# Patient Record
Sex: Male | Born: 1935 | Race: White | Hispanic: No | Marital: Single | Smoking: Never smoker
Health system: Southern US, Community
[De-identification: ages and names within clinical notes are randomized; demographics above are authoritative.]

## PROBLEM LIST (undated history)

## (undated) DIAGNOSIS — I509 Heart failure, unspecified: Secondary | ICD-10-CM

## (undated) DIAGNOSIS — I4891 Unspecified atrial fibrillation: Secondary | ICD-10-CM

## (undated) DIAGNOSIS — I255 Ischemic cardiomyopathy: Secondary | ICD-10-CM

## (undated) DIAGNOSIS — I453 Trifascicular block: Secondary | ICD-10-CM

## (undated) DIAGNOSIS — Z950 Presence of cardiac pacemaker: Secondary | ICD-10-CM

## (undated) HISTORY — DX: Ischemic cardiomyopathy: I25.5

## (undated) HISTORY — DX: Unspecified atrial fibrillation: I48.91

## (undated) HISTORY — PX: AORTIC VALVE REPLACEMENT: SHX41

## (undated) HISTORY — DX: Trifascicular block: I45.3

## (undated) HISTORY — DX: Heart failure, unspecified: I50.9

## (undated) HISTORY — DX: Presence of cardiac pacemaker: Z95.0

## (undated) HISTORY — PX: PACEMAKER INSERTION: SHX728

## (undated) HISTORY — PX: CORONARY ARTERY BYPASS GRAFT: SHX141

---

## 2002-05-09 ENCOUNTER — Ambulatory Visit (HOSPITAL_COMMUNITY): Admission: RE | Admit: 2002-05-09 | Discharge: 2002-05-10 | Payer: Self-pay | Admitting: *Deleted

## 2002-07-14 ENCOUNTER — Ambulatory Visit (HOSPITAL_COMMUNITY): Admission: RE | Admit: 2002-07-14 | Discharge: 2002-07-14 | Payer: Self-pay | Admitting: *Deleted

## 2002-07-20 ENCOUNTER — Inpatient Hospital Stay (HOSPITAL_COMMUNITY): Admission: EM | Admit: 2002-07-20 | Discharge: 2002-07-22 | Payer: Self-pay | Admitting: Emergency Medicine

## 2002-07-20 ENCOUNTER — Encounter: Payer: Self-pay | Admitting: Emergency Medicine

## 2002-07-22 ENCOUNTER — Encounter: Payer: Self-pay | Admitting: Cardiology

## 2002-08-08 ENCOUNTER — Encounter: Payer: Self-pay | Admitting: Surgery

## 2002-08-10 ENCOUNTER — Encounter (INDEPENDENT_AMBULATORY_CARE_PROVIDER_SITE_OTHER): Payer: Self-pay | Admitting: *Deleted

## 2002-08-10 ENCOUNTER — Inpatient Hospital Stay (HOSPITAL_COMMUNITY): Admission: RE | Admit: 2002-08-10 | Discharge: 2002-08-17 | Payer: Self-pay | Admitting: Surgery

## 2002-08-10 ENCOUNTER — Encounter: Payer: Self-pay | Admitting: Surgery

## 2002-08-11 ENCOUNTER — Encounter: Payer: Self-pay | Admitting: Surgery

## 2002-08-12 ENCOUNTER — Encounter: Payer: Self-pay | Admitting: Surgery

## 2002-09-06 ENCOUNTER — Encounter: Payer: Self-pay | Admitting: Surgery

## 2002-09-06 ENCOUNTER — Encounter: Admission: RE | Admit: 2002-09-06 | Discharge: 2002-09-06 | Payer: Self-pay | Admitting: Surgery

## 2005-05-16 ENCOUNTER — Ambulatory Visit: Payer: Self-pay | Admitting: Internal Medicine

## 2005-05-26 ENCOUNTER — Ambulatory Visit: Payer: Self-pay

## 2005-06-20 ENCOUNTER — Ambulatory Visit: Payer: Self-pay | Admitting: Internal Medicine

## 2005-06-30 ENCOUNTER — Ambulatory Visit: Payer: Self-pay | Admitting: Internal Medicine

## 2005-07-04 ENCOUNTER — Ambulatory Visit (HOSPITAL_COMMUNITY): Admission: RE | Admit: 2005-07-04 | Discharge: 2005-07-05 | Payer: Self-pay | Admitting: Internal Medicine

## 2005-07-05 ENCOUNTER — Ambulatory Visit: Payer: Self-pay | Admitting: Cardiology

## 2005-07-21 ENCOUNTER — Ambulatory Visit: Payer: Self-pay

## 2005-10-07 ENCOUNTER — Ambulatory Visit: Payer: Self-pay | Admitting: Internal Medicine

## 2006-04-05 IMAGING — CR DG CHEST 1V PORT
1 series · 1 of 1 positions shown · non-contrast
Comparison: Same day at 6354 hours.

CLINICAL DATA: Status post pacer placement.  Chest pain. 
 PORTABLE CHEST - 07/04/05 AT [DATE]:

[view not recorded]
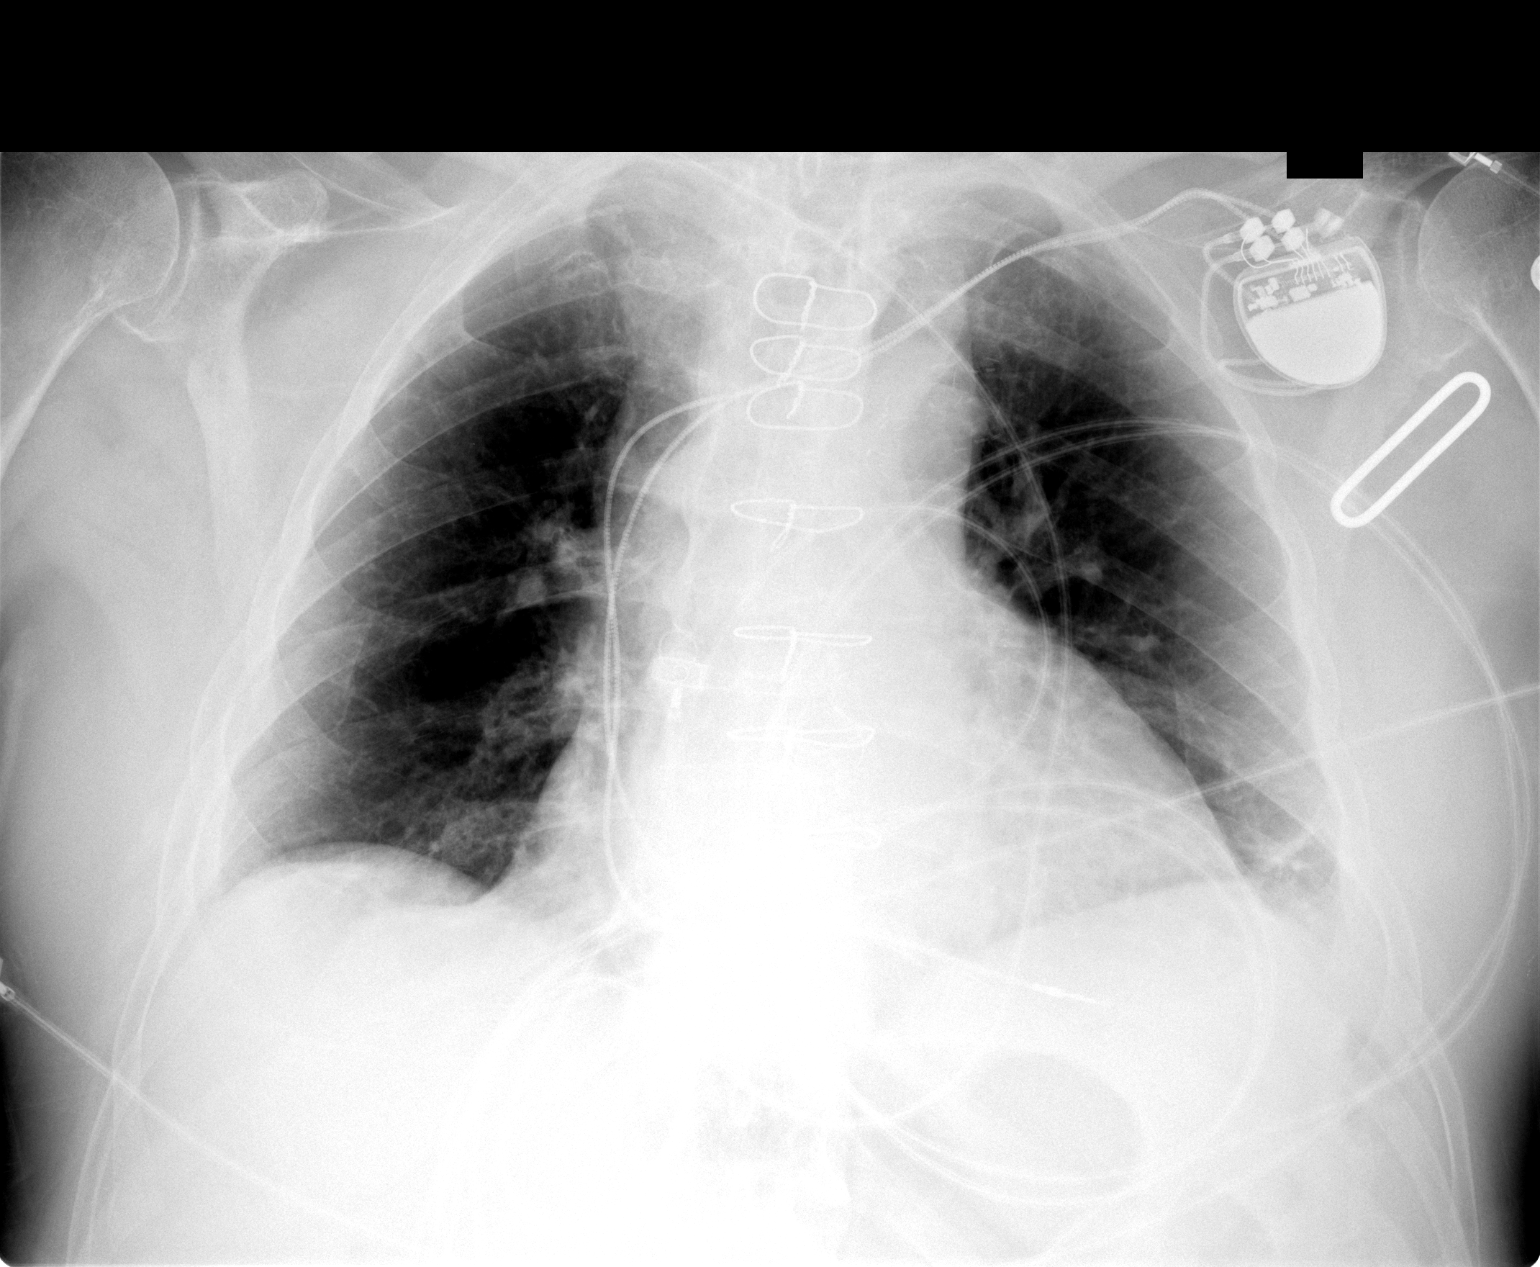

[1 of 1 positions shown; findings below may reference images not displayed]

FINDINGS: The patient now is status post placement of a dual-lead pacer via the left subclavian vein.  The pacer leads terminate in the right ventricle and right atrium.  The cardiopericardial silhouette is stable in size.  There is now subsegmental atelectasis at the left lung base with overall decreased lung volumes.  The nodular density previously seen at the right base may be obscured by low volumes.
IMPRESSION: 1.  Status post placement of dual-lead pacemaker via left subclavian approach, without complication. 
 2.  Low lung volumes with new subsegmental atelectasis of the left base.  Nodular densities in the left base are less well seen in this low-volume state.

## 2006-04-05 IMAGING — CR DG CHEST 2V
2 series · 2 of 2 positions shown · non-contrast
Comparison: 08/12/2002

CLINICAL DATA: Prepacemaker insertion

CHEST - 2 VIEW:

[view not recorded (1 of 2)]
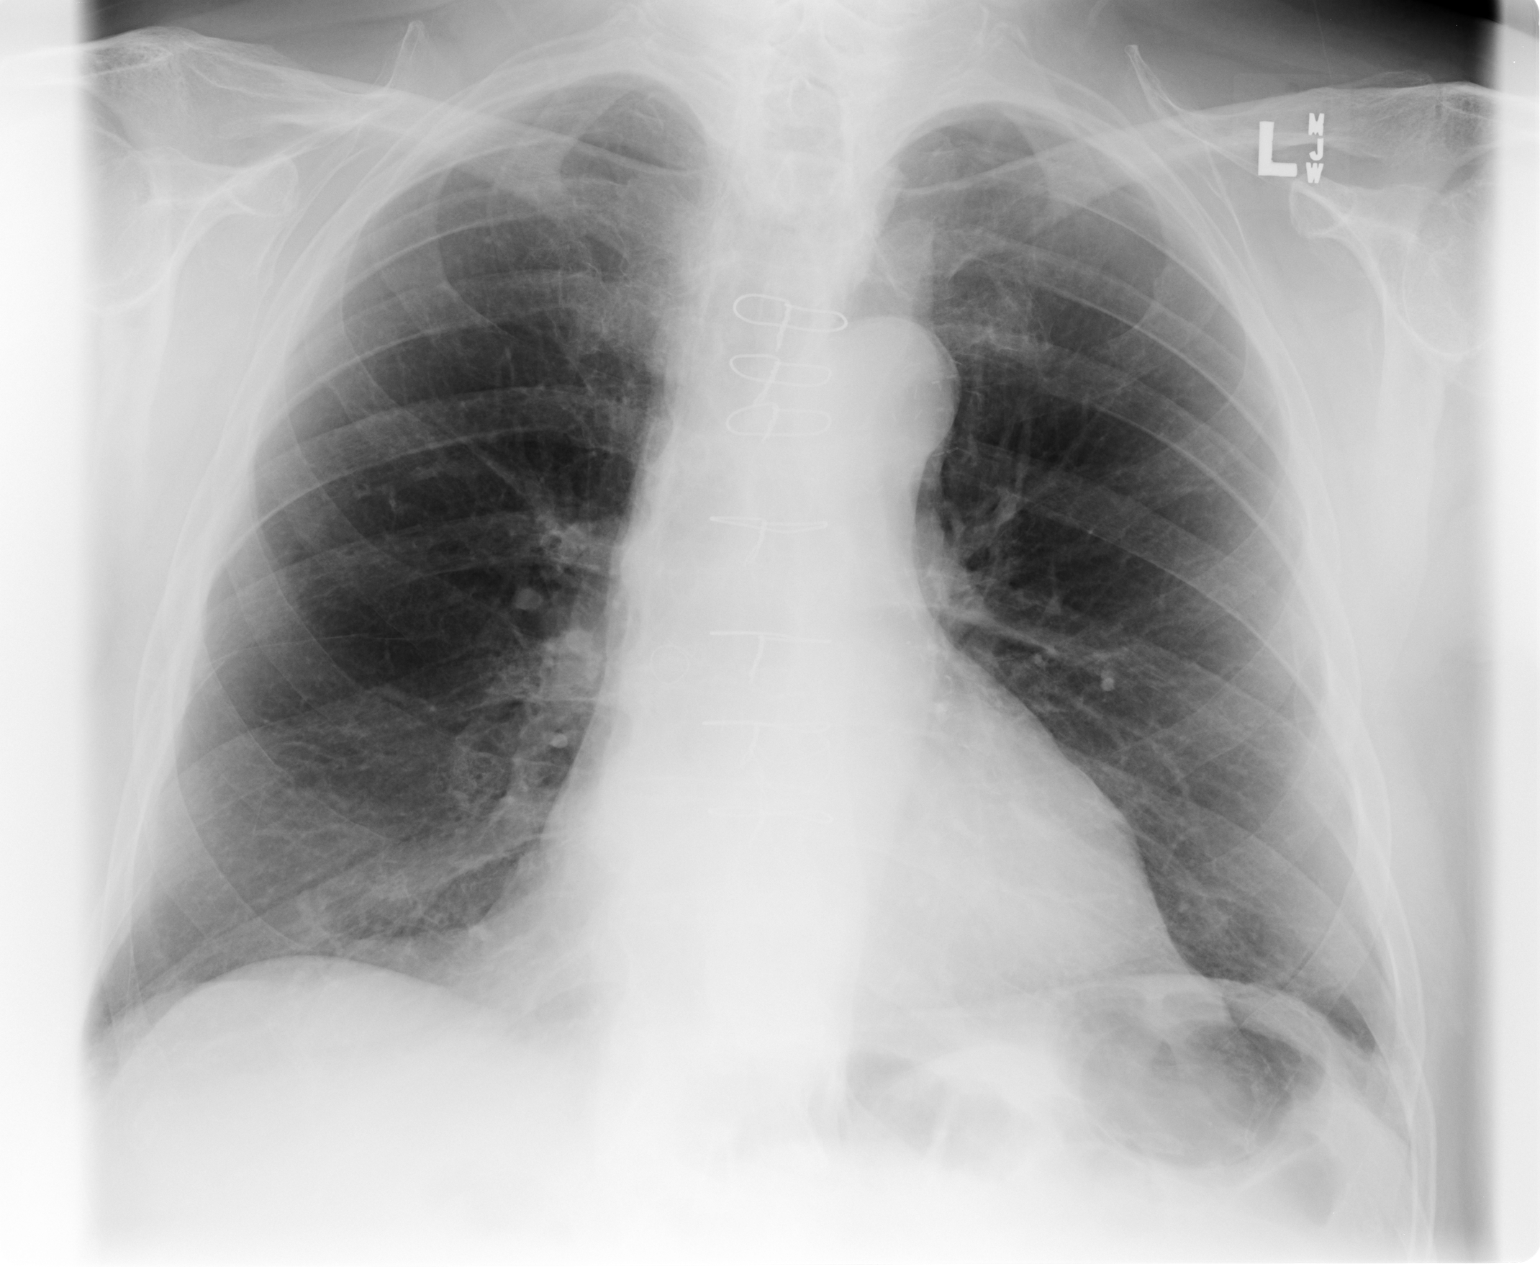

[view not recorded (2 of 2)]
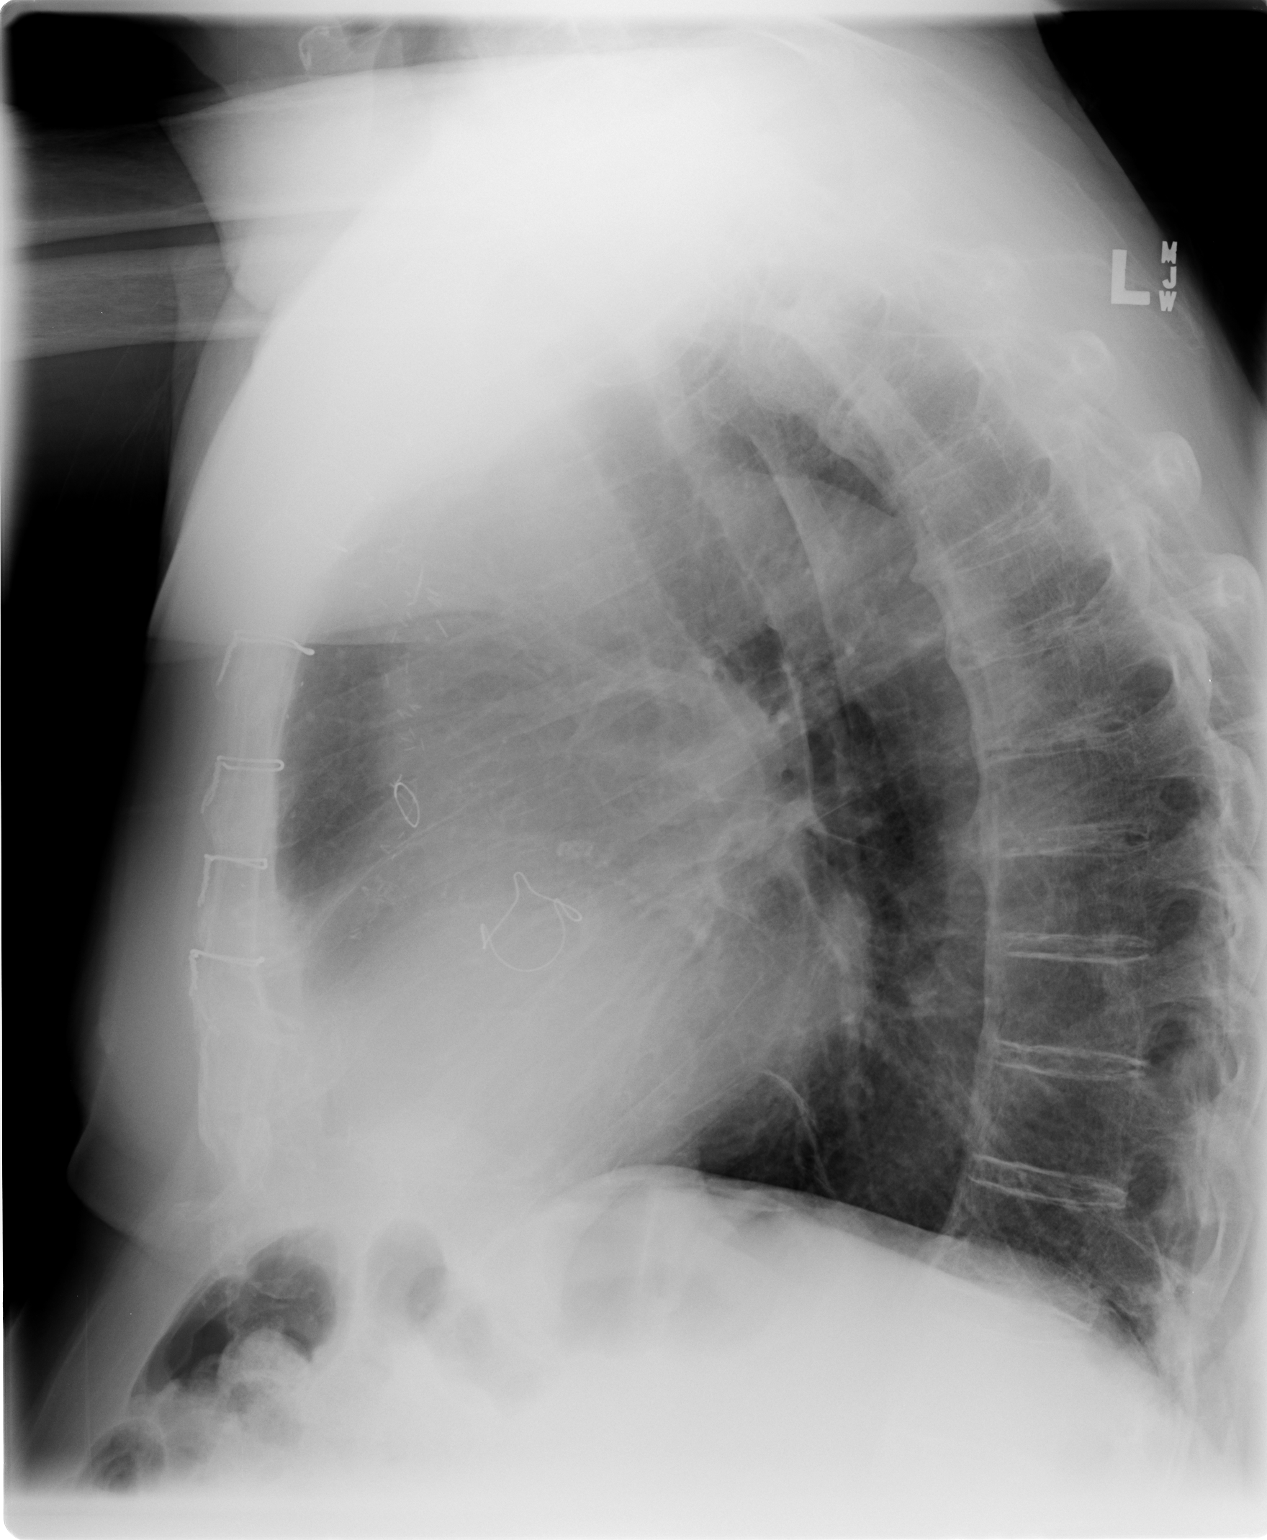

[2 of 2 positions shown; findings below may reference images not displayed]

FINDINGS: Heart and mediastinal contours are within normal limits. There are
bibasilar densities, atelectasis versus scarring. Within the right lung base,
nodular area noted. This was not definitively seen on prior study. Although this
could represent an area of scarring, I cannot completely exclude small nodule.
Recommend CT for further evaluation.
IMPRESSION: Nodular appearing density right base, which could represent scarring, but cannot
completely exclude nodule. Recommend CT.

## 2010-08-12 ENCOUNTER — Encounter (INDEPENDENT_AMBULATORY_CARE_PROVIDER_SITE_OTHER): Payer: Self-pay | Admitting: *Deleted

## 2010-09-19 ENCOUNTER — Encounter: Payer: Self-pay | Admitting: Internal Medicine

## 2010-11-19 ENCOUNTER — Ambulatory Visit: Payer: Self-pay | Admitting: Internal Medicine

## 2010-11-19 DIAGNOSIS — I509 Heart failure, unspecified: Secondary | ICD-10-CM | POA: Insufficient documentation

## 2010-11-19 DIAGNOSIS — Z950 Presence of cardiac pacemaker: Secondary | ICD-10-CM

## 2010-11-19 DIAGNOSIS — I25810 Atherosclerosis of coronary artery bypass graft(s) without angina pectoris: Secondary | ICD-10-CM

## 2010-11-19 DIAGNOSIS — I4891 Unspecified atrial fibrillation: Secondary | ICD-10-CM

## 2011-01-21 NOTE — Letter (Signed)
Summary: Device-Delinquent Check  [facility], Main Office  [address]   [place], [place] [address]   Phone: [phone]  Fax: [phone]     August 12, 2010 MRN: [id number]   [facility] [address] [place], [place]  [address]   Dear Alexander [name],  According to our records, you have not had your implanted device checked in the recommended period of time.  We are unable to determine appropriate device function without checking your device on a regular basis.  Please call our office to schedule an appointment, with Dr. Graciela Husbands in [place],  as soon as possible.  If you are having your device checked by another physician, please call us so that we may update our records.  Thank you, Altha Harm, LPN  August 12, 2010 12:56 PM   [facility]  certified mail

## 2011-01-21 NOTE — Cardiovascular Report (Signed)
Summary: Office Visit   Office Visit   Imported By: Roderic Ovens 11/26/2010 08:34:41  _____________________________________________________________________  External Attachment:    Type:   Image     Comment:   External Document

## 2011-01-21 NOTE — Assessment & Plan Note (Signed)
Summary: device ck/past due/mt   History of Present Illness: Alexander [name] is seen at the hiatus of more than 5 years. 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 He also has a history of bypass surgery and aortic valve replacement.  He is chronic shortness of breath. He denies chest pain. There has been no lightheadedness or syncope. He has mild peripheral edema.  Current Medications (verified): 1)  Digoxin 0.25 Mg/ml Soln (Digoxin) .... Once Daily 2)  Lipitor 40 Mg Tabs (Atorvastatin Calcium) .... Take One Tablet By Mouth Daily. 3)  Isosorbide Mononitrate Cr 60 Mg Xr24h-Tab (Isosorbide Mononitrate) .... Take 1 & 1/2  Tablet By Mouth Daily 4)  Furosemide 40 Mg Tabs (Furosemide) .... Take One Tablet By Mouth Daily. 5)  Metoprolol Succinate 50 Mg Xr24h-Tab (Metoprolol Succinate) .... Take One Tablet By Mouth Daily 6)  Aspir-Low 81 Mg Tbec (Aspirin) .... 2 Tablets Once Daily 7)  Folgard 800-10-115 Mcg-Mg-Mcg Tabs (Folic Acid-Vit B6-Vit B12) .... Once Daily 8)  Prilosec 20 Mg Cpdr (Omeprazole) .... Once Daily 9)  Levothroid 50 Mcg Tabs (Levothyroxine Sodium) .... Once Daily 10)  Klor-Con 20 Meq Pack (Potassium Chloride) .... Once Daily 11)  Tylenol Extra Strength 500 Mg Tabs (Acetaminophen) .... As Needed  Vital Signs:  Patient profile:   77 year old male Height:      72 inches Weight:      206.25 pounds BMI:     28.07 Pulse rate:   59 / minute BP sitting:   121 / 54  (left arm) Cuff size:   regular  Vitals Entered By: Caralee Ates CMA (November 19, 2010 12:26 PM)  Physical Exam  General:  The patient was alert and oriented in no acute distress. HEENT Normal.  Neck veins were flat, carotids were brisk.  Lungs were clear.  Heart sounds were regular without murmurs or gallops.  Abdomen was soft with active bowel sounds. There is no clubbing cyanosis; trace edema Skin Warm and dry    PPM  Specifications Following MD:  Sherryl Manges, MD     PPM Vendor:  [facility] [employer]     PPM Model Number:  [id number]     PPM Serial Number:  [id number] PPM DOI:  10/07/2005     PPM Implanting MD:  Sherryl Manges, MD  Lead 1    Location: RA     DOI: 07/04/2005     Model #: [id number]     Serial #: [id number]     Status: active Lead 2    Location: RV     DOI: 07/04/2005     Model #: [id number]     Serial #: [id number]     Status: active  PPM Follow Up Battery Voltage:  GOOD V     Battery Est. Longevity:  >5.0 YRS       PPM Device Measurements Atrium  Amplitude: 2.0 mV, Impedance: 490 ohms, Threshold: 0.5 V at 0.40 msec Right Ventricle  Amplitude: 12.0 mV, Impedance: 620 ohms, Threshold: 0.6 V at 0.40 msec  Episodes MS Episodes:  29.7     Percent Mode Switch:  63%     Ventricular High Rate:  0     Atrial Pacing:  4%     Ventricular Pacing:  75%  Parameters Mode:  DDDR     Lower Rate Limit:  60     Upper Rate Limit:  120 Paced AV Delay:  80     Sensed AV Delay:  240 Next Cardiology Appt Due:  05/23/2011 Tech Comments:  63% OF TIME IN MODE SWITCH. - COUMADIN. PT WAS ON COUMADIN AT SOME POINT BUT WAS TAKEN OFF BUT DOESN'T REMEMBER WHY.  NORMAL DEVICE FUNCTION.  NO CHANGES MADE. ROV IN 6 MTHS W/DEVICE CLINIC. Vella Kohler  November 19, 2010 12:47 PM  Impression & Recommendations:  Problem # 1:  ATRIAL FIBRILLATION (ICD-427.31) the patient has atrial fibrillation with a controlled ventricular response. His thromboembolic risk profile is sufficiently high that Coumadin would be appropriate unless there is a strong contraindication. I will defer this decision to Dr. Lawana Pai. His updated medication list for this problem includes:    Digoxin 0.25 Mg/ml Soln (Digoxin) ..... Once daily    Metoprolol Succinate 50 Mg Xr24h-tab (Metoprolol succinate) .Marland Kitchen... Take one tablet by mouth daily    Aspir-low 81 Mg Tbec (Aspirin) .Marland Kitchen... 2 tablets once daily  Problem # 2:  PACEMAKER, PERMANENT (ICD-V45.01) Device parameters and data  were reviewed and no changes were made  Problem # 3:  CHF (ICD-428.0) stable over many years His updated medication list for this problem includes:    Digoxin 0.25 Mg/ml Soln (Digoxin) ..... Once daily    Isosorbide Mononitrate Cr 60 Mg Xr24h-tab (Isosorbide mononitrate) .Marland Kitchen... Take 1 & 1/2  tablet by mouth daily    Furosemide 40 Mg Tabs (Furosemide) .Marland Kitchen... Take one tablet by mouth daily.    Metoprolol Succinate 50 Mg Xr24h-tab (Metoprolol succinate) .Marland Kitchen... Take one tablet by mouth daily    Aspir-low 81 Mg Tbec (Aspirin) .Marland Kitchen... 2 tablets once daily  Problem # 4:  CAD, AUTOLOGOUS BYPASS GRAFT (ICD-414.02) without chest pain His updated medication list for this problem includes:    Isosorbide Mononitrate Cr 60 Mg Xr24h-tab (Isosorbide mononitrate) .Marland Kitchen... Take 1 & 1/2  tablet by mouth daily    Metoprolol Succinate 50 Mg Xr24h-tab (Metoprolol succinate) .Marland Kitchen... Take one tablet by mouth daily    Aspir-low 81 Mg Tbec (Aspirin) .Marland Kitchen... 2 tablets once daily  Problem # 5:  TRIFASCICULAR BLOCK (ICD-426.12) table post pacemaker implantation with about 80% ventricular pacing His updated medication list for this problem includes:    Isosorbide Mononitrate Cr 60 Mg Xr24h-tab (Isosorbide mononitrate) .Marland Kitchen... Take 1 & 1/2  tablet by mouth daily    Metoprolol Succinate 50 Mg Xr24h-tab (Metoprolol succinate) .Marland Kitchen... Take one tablet by mouth daily    Aspir-low 81 Mg Tbec (Aspirin) .Marland Kitchen... 2 tablets once daily  Patient Instructions: 1)  Your physician recommends that you continue on your current medications as directed. Please refer to the Current Medication list given to you today. 2)  Your physician wants you to follow-up in: 6 months with Pacer Clinic and 1 year with Dr. Graciela Husbands.   You will receive a reminder letter in the mail two months in advance. If you don't receive a letter, please call our office to schedule the follow-up appointment.

## 2011-01-21 NOTE — Cardiovascular Report (Signed)
Summary: Certified Letter - Delivered (Appt. Made)  Certified Letter - Delivered (Appt. Made)   Imported By: Debby Freiberg 10/29/2010 12:44:46  _____________________________________________________________________  External Attachment:    Type:   Image     Comment:   External Document

## 2011-05-09 NOTE — H&P (Signed)
. [facility]  Patient:    Alexander [name], Alexander [name] Visit Number: [id number] MRN: [id number]          Service Type: Attending:  Daisey Must, M.D. [facility] Dictated by:   Lavella Hammock, P.A.-C. Adm. Date:  07/14/02                           History and Physical  DATE OF BIRTH: 04/23/1918  CHIEF COMPLAINT: Chest pain, shortness of breath.  HISTORY OF PRESENT ILLNESS: The patient is an 77 year old male who had percutaneous intervention in May of 2003 for shortness of breath and diaphoresis that was felt to be an anginal equivalent. At that time, he also had an abnormal Cardiolite.  Since discharge from the hospital, the patient was symptom free. However, after going home on Tuesday, on Sunday, he developed an extensive pruritic rash for which he was seen by the cardiologist and his primary MD. He received a steroid injection, which resolved his symptoms and was advised to stop the Plavix. The patient did say his symptoms have not recurred. However, gradually over the last two weeks, the patients shortness of breath and diaphoresis have recurred. He describes these symptoms as occurring whenever he gets up and does any significant amount of exertion or whenever he bends over. He also has associated mild left chest numbness. He denies history of chest pain. He has never tried nitroglycerin for these symptoms and he does not have symptoms at rest. The symptoms are generally relieved by rest in about 5-10 minutes. However, because of the increasing frequency of these symptoms, he has become less and less active in an effort to avoid them.  PAST MEDICAL HISTORY:  1. Status post PTCA and stent to the proximal RCA as well as PTCA and stent     x2 to the mid RCA as well as PTCA and stent to the mid LAD on May 09, 2002.  2. Status post catheterization May 2003 showing 30% left main, LAD     70 % in the midportion (0% after stent), circumflex 30%, RCA 50%, then  70%,     then 80%, all reduced to less than 10% with stent.  3. Left ventricular dysfunction with an EF of 45% and mild apical hypokinesis     by catheterization in May of 2003.  4. Status post 2-D echocardiogram May of 2003 showing EF 40-50% with     hypokinesis of the septal wall and mild aortic valve stenosis with     moderate to severe aortic valvular regurgitation. The mean transaortic     gradient was 11 mmHg with a valve area of 2.25 cm sq. There was mild     aortic root dilatation and mitral valvular regurgitation and mildly     to moderately dilated left atrium.  5. History of PTCA in 1991.  6. Hypertension.  7. Hyperlipidemia.  8. Family history of coronary artery disease.  9. History of prostate cancer, receiving monthly injections. 10. History of rheumatic fever as a child. 11. History of osteoarthritis. 12. History of gastroesophageal reflux disease symptoms.  PAST SURGICAL HISTORY: Cardiac catheterization x2, appendectomy, tonsillectomy.  FAMILY HISTORY: Mother died at age 99 of a myocardial infarction with a father dying at age 28 from bladder cancer.  ALLERGIES: PLAVIX and SULFA DRUGS.  CURRENT MEDICATIONS:  1. Toprol-XL 25 mg q.d.  2. Digoxin 0.125 mg q.d.  3. Vioxx 25 mg q.d.  4. Lasix 20 mg 2 p.o. q.d.  5. Lipitor 20 mg p.o. q.d.  6. Nitroglycerin p.r.n.  SOCIAL HISTORY: The patient lives in [place] with his wife and is a retired Music therapist. He has no history of tobacco or ETOH abuse. He eats moderately healthy diet.  REVIEW OF SYSTEMS: Positive for recent constipation. The patient denies any regular GERD symptoms. He denies melena or hematemesis or hemoptysis. He has occasional arthralgias. He denies any significant GU symptoms. Other than the shortness of breath and left chest numbness described above, the review of systems is otherwise negative.  PHYSICAL EXAMINATION:  VITAL SIGNS: The patients temperature is 97.1 with blood pressure 125/62 and a  heart rate of 70 with a respiratory rate of 18.  GENERAL: He is a well-developed, elderly white male in no acute distress.  HEENT: Head is normocephalic and atraumatic with pupils equal, round, and reactive to light and accommodation. Extraocular movements are intact.  NECK: There is no JVD. Bilateral carotid bruits are appreciated with the left carotid bruit being much higher in pitch and louder than the right one. There is no thyromegaly noted.  CHEST: Clear to auscultation bilaterally.  CARDIOVASCULAR: His heart is regular in rate and rhythm with 2/6 systolic ejection murmur at the apex.  ABDOMEN: Soft and nontender with active bowel sounds and no bruit appreciated.  EXTREMITIES: There are no femoral bruits appreciated. Previous catheterization site is well healed. There is no lower extremity edema. Distal pulses are 2+ all four extremities.  NEUROLOGICAL: He is alert and oriented with no focal deficits noted and cranial nerves II-XII grossly intact.  ECG: Sinus rhythm with first-degree AV block and no acute ischemic changes. There is some minor inferior T wave changes from the ECG dated May 09, 2002, but they are not felt ischemic.  Chest x-ray: The patient states he had one done in [facility] within the last two months and it was within normal limits so it will not be repeated.  LABORATORY VALUES: Hemoglobin 12.1, hematocrit 34.9, WBC 6.4, platelets 212. Sodium 139, potassium 4.1, chloride 110, CO2 22, BUN 20, creatinine 1.4, glucose 123. INR 1.1 with a PTT of 26.  ASSESSMENT AND PLAN: 1. Shortness of breath/left chest pain: This may be anginal equivalent since  the symptoms began once the patient had to discontinue his Plavix secondary  to an allergic reaction. The patient is for cardiac catheterization today    and if the symptoms are not due to coronary artery disease, they are quite    possibly due to problems with his aortic valve and these will be     evaluated by the doctor.  2. The patient is otherwise stable and will be continued on his home    medications. Dictated by:   Lavella Hammock, P.A.-C. Attending:  Daisey Must, M.D. [facility] DD:  07/14/02 TD:  07/14/02 Job: [id number]

## 2011-05-09 NOTE — Discharge Summary (Signed)
[place]  Patient:    Alexander [name], Alexander [name] Visit Number: [id number] MRN: [id number]          Service Type: CAT Location: [id number] Attending Physician:  Veneda Melter Dictated by:   Lavella Hammock, P.A.-C. Admit Date:  05/09/2002 Discharge Date: 05/10/2002   CC:         R. Revankar, M.D., [place], [place]  Roney Marion, M.D., [place], [place]   Referring Physician Discharge [facility]  DATE OF BIRTH:  May 27, 1918  PROCEDURES: 1. Cardiac catheterization. 2. Coronary arteriogram. 3. Left ventriculogram.  HOSPITAL COURSE:  Mr. Alexander [name] is an 77 year old male with a history of PTCA in 1991 at [facility].  Since that time, he has done well and has not had any recent cardiac follow-up until he was seen by Dr. Tomie China on Apr 22, 2002.  At that time, he was scheduled for a Cardiolite which was performed on May 04, 2002.  The Cardiolite showed scar as well as an EF of 27% and a dilated left ventricle.  Because of the abnormalities on the Cardiolite, he was referred for cardiac catheterization.  Additionally, he had an echocardiogram showing moderately severe AS and an EF of 55%.  The patient describes a long history of exertional chest pain as well as shortness of breath and worsening dyspnea on exertion over the last few months.  He gets short of breath just with bending over as well.  The patient chest pain is relieved by resting approximately five minutes, and he has never taken nitroglycerin for it.  It is a 3/10 at its worst and is not associated with nausea or diaphoresis.  He has never had symptoms at rest.  He had a cardiac catheterization on May 09, 2002, showing a 30% left main stenosis and a LAD with a 70% stenosis in the mid portion.  He had PTCA and stent to this lesion which reduced this stenosis to 0% with TIMI 3 flow.  The patient had a 30% stenosis in the circumflex, and the RCA was dominant. It had a 90% proximal stenosis, followed by a  50% stenosis and then in the mid distal portion, there were 70% and 80% stenoses.  He had a total of three stents placed from the RCA, reducing the proximal stenosis to 0% and the mid and distal stenoses to 0%, all with TIMI 3 flow.  His EF was approximately 45% with apical hypokinesis by left ventriculogram.  The patient tolerated the procedure well, and the sheath was removed without difficulty.  The next day, the patient was ambulatory without difficulty.  He had no chest pain or shortness of breath with ambulation, and his cath site was without ecchymosis, bruit, or hematoma.  His postprocedure labs were within normal limits except for a very mild anemia of 10.6.  His postprocedure CK-MBs were also negative.  He was considered stable for discharge on May 10, 2002.  LABORATORY VALUES:  Hemoglobin 12.6, hematocrit 35.6, WBC 7.1, platelets 226. Sodium 136, potassium 3.6, chloride 110, CO2 21, BUN 19, creatinine 1.2, glucose 98.  Postprocedure CK-MBs negative x 2.  2-D echocardiogram showing left ventricle was dilated with an EF of 40-50%. Findings were suggestive of hypokinesis of the septal wall, and left ventricular thickness was mildly increased.  The aortic valve was not well-visualized, but Doppler findings were consistent with mild aortic valve stenosis with the aortic root being 42 mm, and the peak AV velocity 222 cm per second with a mean  AV gradient of 11 and a peak AV gradient of 20. The AV area was 2.25 sq cm.  There moderate to severe aortic valvular regurgitation.  There was mild MR, and the left atrium was mildly to moderately dilated.  There was also mild TR.  There was no pericardial effusion.  The AI was difficult to quantitate due to eccentric nature of the jets, and a TEE was suggested to more fully evaluate it.  DISCHARGE CONDITION:  Improved.  DISCHARGE DIAGNOSES: 1. Chest pain, unstable anginal pain, status post PTCA and stent x 4 this    admission. 2. Status  post PTCA in 1991. 3. Hypertension. 4. Hyperlipidemia. 5. Family history of coronary artery disease. 6. Mild left ventricular dysfunction with an ejection fraction of 40-50% by    echocardiogram this admission as well as 40% by cath this admission. 7. History of prostate cancer, on monthly injections. 8. History of rheumatic fever as a child. 9. History of osteoarthritis.  DISCHARGE INSTRUCTIONS: 1. His activity level is to include no driving, sexual or strenuous activity    for two days. 2. He is to call our office for problems with the cath site. 3. He is to follow up with Dr. Josiah Lobo in about two weeks. 4. He is to stick to a low fat and salt diet.  DISCHARGE MEDICATIONS:  1. Aspirin 325 mg q.d.  2. Plavix 75 mg 1 q.d.  3. Lasix 20 mg q.d.  4. Altace 10 mg q.d.  5. Vioxx 25 mg q.d.  6. Digoxin 0.125 mg q.d.  7. Megace 40 mg q.d.  8. Cholestyramine q.d.  9. Nitroglycerin 0.4 mg p.r.n. 10. Zantac 75 mg q.d. 11. Tylenol Arthritis p.r.n.Marland Kitchen Dictated by:   Lavella Hammock, P.A.-C. Attending Physician:  Veneda Melter DD:  05/10/02 TD:  05/10/02 Job: [id number]

## 2011-05-09 NOTE — Op Note (Signed)
NAME:  Alexander [name], Alexander [name]                             ACCOUNT NO.:  [id number]   MEDICAL RECORD NO.:  [id number]                   PATIENT TYPE:  INP   LOCATION:  2011                                 FACILITY:  [facility]   PHYSICIAN:  Burna Forts, M.D.             DATE OF BIRTH:  [birth year]   DATE OF PROCEDURE:  08/10/2002  DATE OF DISCHARGE:                                 OPERATIVE REPORT   PROCEDURE:  Intraoperative transesophageal echocardiogram.   INDICATIONS:  The patient is an 77 year old gentleman who presents today for  coronary artery bypass grafting and replacement of the aortic valve by Alleen Borne, M.D.  Our plan is to place a TEE probe for evaluation of cardiac  structures and function both pre- and post-valvular replacement.   DESCRIPTION OF PROCEDURE:  He was brought to the holding area on the morning  of surgery, where under local anesthesia with sedation, a pulmonary artery  catheter and radial artery lines were placed.  He was then taken to the  operating room, where after the induction of general endotracheal  anesthesia, the trachea was intubated and the TEE probe was lubricated and  passed through the oropharynx and into the stomach, then withdrawn for  imaging of the cardiac structures.   Pre-cardiopulmonary bypass TEE examination:   Left ventricle:  This is an enlarged left ventricular chamber with minimal  thickened walls in short axis view of the chamber itself.  There is good  overall contractility noted.  The inferior wall appeared slightly hypo- to  dyskinetic in the short-axis view.  The rest of the cardiac segments do  appear contractile and with satisfactory inward excursion during systolic  contraction.  There are no masses noted within.   Mitral valve:  This is a thickened mitral anterior leaflet within anterior  and posterior.  Of note, just above the level of the anterior leaflet upon  coaptation during systole, there does appear to be a  small 2-3 mm object  which could be just a portion of the leaflet itself or a chordae appearing  above the level of the valve.  Doppler examination reveals 1+ mitral  regurgitant flow.  On the short axis view looking from the apex, you can see  this small object which just appears to be nothing more than a stretched  chordae or portion of the anterior leaflet itself.  There does not appear to  be any significant prolapse except for that small piece of chordae, if that  is, in fact, what it is, or leaflet.  The valve appears to be coapting  appropriately just below the level of the annulus.   Left atrium:  This is a normal left atrial chamber.   Aortic valve:  This is a calcified three-leafed, three-cusped aortic valve.  There is a little bit of heavy calcium noted, particularly on the right  noncoronary cusp.  It does appear to open satisfactorily.  The primary  dynamic lesion is, in fact, aortic insufficiency with a central area that  appears to be a space associated with lack of coaptation in the upper  leaflets in the short axis view.  On the long axis view, there is fairly  significant aortic regurgitant flow noted with a nice jet noted during  diastole of about 3+.   Right ventricle:  This is a normal right ventricular chamber.   Tricuspid valve:  Normal tricuspid valve.   Right atrium:  Normal right atrial chamber.   The patient is placed on cardiopulmonary bypass.  Coronary artery bypass  grafting is carried out.  The diseased aortic valve is removed and replaced  with a #25 pericardial tissue valve.  De-airing maneuvers are carried out.  The patient is rewarmed and separated from cardiopulmonary bypass.   Post-cardiopulmonary bypass TEE examination (limited exam):   Left ventricle:  There is good overall contractility noted in all segments  now in the left ventricular chamber in the short axis view as well as long  axis view.   Mitral valve:  The mitral valve remains  unchanged.   Aortic valve:  In place of the diseased aortic valve could not be seen the  struts of and circular attachment of the pericardial tissue valve.  The  leaflets were also able to be visualized and seen.  They opened  appropriately and closed appropriately during systolic contraction and  diastole, respectively.  It appeared to be seated well and functioning in an  entirely appropriate manner.   The patient was removed to the cardiac intensive care unit in stable  condition.                                               Burna Forts, M.D.    JTM/MEDQ  D:  08/10/2002  T:  08/13/2002  Job:  [id number]   cc:   Anesthesia Department

## 2011-05-09 NOTE — Discharge Summary (Signed)
NAME:  Alexander [name], Alexander [name]                             ACCOUNT NO.:  [id number]   MEDICAL RECORD NO.:  [id number]                   PATIENT TYPE:  INP   LOCATION:  2011                                 FACILITY:  [facility]   PHYSICIAN:  Levin Erp. Steward, P.A.                DATE OF BIRTH:  Dec 20, 1918   DATE OF ADMISSION:  08/10/2002  DATE OF DISCHARGE:  08/17/2002                                 DISCHARGE SUMMARY   ADMISSION DIAGNOSES:  1. Coronary artery disease.  2. Aortic insufficiency.   SECONDARY DIAGNOSES:  1. Postoperative atrial fibrillation.  2. Postoperative anemia secondary to blood loss.   DISCHARGE DIAGNOSIS:  Coronary artery disease.   HOSPITAL COURSE:  The patient was admitted to  [facility] on August 12, 2002, after being  seen and evaluated by Dr. Laneta Simmers August 17, 2002 as  an outpatient for coronary artery disease and aortic insufficiency. On the  day of admission the patient underwent a coronary artery bypass graft x 2  with a saphenous vein graft to the right coronary and a left internal  mammary artery anastomosed to the left anterior descending artery. He also  underwent an aortic valve replacement with a 25 mm pericardial valve. No  complications were noted during the procedure.   Postoperatively the patient had atrial fibrillation which was rate  controlled with Lopressor and amiodarone. He converted back to normal sinus  rhythm on amiodarone. Because of his aortic valve, he was  anticoagulated  with Coumadin. He had some postoperative anemia secondary to blood loss.  This was treated with packed red blood cells. By the time of discharge the  patient's hemoglobin and hematocrit were stabilized at 8.8 and 26.0. He was  subsequently deemed stable for discharge on August 17, 2002.   DISCHARGE MEDICATIONS:  1. Tylox 1 to 2 tablets q.4-6h. p.r.n. pain.  2. Amiodarone 200 mg 3 tablets b.i.d. x 7 days then 1 tablet b.i.d.  3. Aspirin 81 mg 1 q.d.  4. Toprol XL  25 mg 1 q.d.  5. Lipitor 20 mg 1 q.d.  6. Niferex 150 1 q.d.  7. Colace 100 mg 1 tablet  b.i.d.  8. Coumadin. The patient is to follow  the Coumadin dose to be dictated by     his INR at the time of discharge.   DISCHARGE INSTRUCTIONS:  Activity, the patient was  told no driving,  strenuous activity or lifting heavy objects. He was  told to walk daily and  continue breathing exercises. Discharge  diet, low fat, low salt. Regarding  wound care, the patient was told he could shower and clean his incisions  with soap and water.   DISPOSITION:  Discharged to home.    FOLLOW UP:  The patient was  told to call his cardiologist for a two week  followup appointment.  He was  also instructed to see  Dr. Laneta Simmers at the [facility]  office on Tuesday, September 06, 2002, at 10 o'clock a.m. He was  told to  bring a  chest x-ray with him.                                                Levin Erp. Kirstie Peri.    BGS/MEDQ  D:  08/16/2002  T:  08/18/2002  Job:  [id number]   cc:   [facility]

## 2011-05-09 NOTE — Discharge Summary (Signed)
NAME:  Alexander [name], Alexander [name]                             ACCOUNT NO.:  [id number]   MEDICAL RECORD NO.:  [id number]                   PATIENT TYPE:  INP   LOCATION:  2001                                 FACILITY:  [facility]   PHYSICIAN:  Alleen Borne, M.D.               DATE OF BIRTH:  1918/11/22   DATE OF ADMISSION:  07/20/2002  DATE OF DISCHARGE:  07/22/2002                                 DISCHARGE SUMMARY   HISTORY OF PRESENT ILLNESS:  The patient is an 77 year old white male who  was diagnosed with moderate to severe AI by catheterization on July 14, 2002.  He was scheduled to see Dr. Laneta Simmers on July 27, 2002, to be evaluated  for possible AVR.  Since his discharge, the patient has had shortness of  breath with minimal exertion.  Today after showering, the patient became  markedly dyspneic and diaphoretic after resting symptoms decreased.  He saw  Dr. Tomie China who wanted him to go to [facility], the patient  preferred to come directly to [facility].   PAST MEDICAL HISTORY:  1. Coronary artery disease with patent stent on catheterization on July 14, 2002.  Please refer to dictated catheterization report.  2. Hypertension.  3. Hyperlipidemia.  4. Prostate CA.  5. Lupron therapy.  6. Rheumatic heart disease as a child.  7. Osteoarthritis.  8. GERD.   LABORATORY DATA AND X-RAY FINDINGS:  Admission H&H was 13.5 and 38.8 with  normal indices, platelets 240 with WBC 7.8.  Sodium 141, potassium 3.8, BUN  20, creatinine 1.5, glucose 120.  CKs, MBs and troponins were negative for  myocardial infarction.  Admission BNP was 49.6.  This was repeated on July  30, and it was less than 30.  On August 1, sodium was 141, potassium 3.9,  BUN 29, creatinine 1.7, glucose 108.   Prior to discharge, an echocardiogram was performed.  Verbal reports from  Dr. Andee Lineman states that it is felt that he does not surgery at this time in  regards to his aortic valve.  Chest x-ray showed no acute  distress.   HOSPITAL COURSE:  The patient was admitted to the unit 2000.  It was noted  that his heart rate had increased to approximately 140 beats per minute and  his Toprol was increased.  The patient was asymptomatic.  Dr. Corinda Gubler  discussed with Dr. Laneta Simmers, and it was felt that they both would consider  further medical treatment before aortic valve replacement.  An ACE inhibitor  was added.  Previous catheterization films were reviewed with Dr. Chales Abrahams and  it was felt that if aortic valve replacement was planned, they would not  bypass the stent.  On July 22, 2002, Dr. Corinda Gubler ordered an echocardiogram  which was reviewed by Dr. Andee Lineman.  Dr. Andee Lineman notes on August 1, that his  echocardiogram in May 2003,  showed only mild AI and at the most, mild to  moderate AI.  The left ventricle dilated and end-systolic ESD was less than  55 mm.  Dr. Andee Lineman did not see a clear indication for surgery at this point.  He would recommend followup echocardiogram in approximately six months in  the office.  A repeat BNP level was checked, although he felt that AI was  not severe enough.  He did suspect that he may need more diuresis.   DISCHARGE DIAGNOSIS:  Shortness of breath, questionable etiology, eventually  related to mild to moderate aortic insufficiency.  No evidence of congestive  heart failure or sinus tachycardia.   DISPOSITION:  He was discharged to home.   DISCHARGE MEDICATIONS:  1. Altace 2.5 mg b.i.d.  2. Toprol increased to 50 mg q.d.  3. Digoxin 0.125 q.d.  4. Vioxx 25 mg q.d.  5. Lasix 40 mg q.d.  6. Lipitor 20 mg q.h.s.  7. Coated aspirin 325 mg q.d.   DIET:  Maintain low salt, low fat, low cholesterol diet.   FOLLOW UP:  Call Dr. Tomie China to make a follow-up appointment earlier than  the one that he has scheduled on August 15, or he may keep it per Dr.  Kem Parkinson instructions.     Leighton Parody, P.A.-C                  Alleen Borne, M.D.    AW/MEDQ  D:   07/22/2002  T:  07/28/2002  Job:  [id number]   cc:   Aundra Dubin. Revankar, M.D.   [place], M.D.

## 2011-05-09 NOTE — Op Note (Signed)
NAME:  Hausen, ODAI WIMMER                             ACCOUNT NO.:  [id number]   MEDICAL RECORD NO.:  [id number]                   PATIENT TYPE:  INP   LOCATION:  2011                                 FACILITY:  [facility]   PHYSICIAN:  Ernestina Joe DICTATOR                    DATE OF BIRTH:  24-Oct-1918   DATE OF PROCEDURE:  08/10/2002  DATE OF DISCHARGE:                                 OPERATIVE REPORT   PREOPERATIVE DIAGNOSES:  1. Severe aortic insufficiency.  2. Severe two-vessel coronary artery disease.   POSTOPERATIVE DIAGNOSES:  1. Severe aortic insufficiency.  2. Severe two-vessel coronary artery disease.   PROCEDURES:  1. Median sternotomy.  2. Extracorporeal circulation.  3. Coronary artery bypass graft surgery x2 using a left internal mammary     artery graft to the left anterior descending coronary artery, with a     saphenous vein graft to the right coronary artery.  4. Aortic valve replacement using a 25 mm pericardial valve.   SURGEON:  Alleen Borne, M.D.   ASSISTANTSSalvatore Decent. Cornelius Moras, M.D., and Myrlene Broker, P.A.   ANESTHESIA:  General endotracheal.   CLINICAL HISTORY:  This patient is an 77 year old gentleman with a history  of coronary disease, status post percutaneous coronary intervention and  stenting of the right coronary artery as well as the LAD in 5/03.  An  echocardiogram done at that time showed a moderate to severe aortic  insufficiency with a mean transaortic valve gradient of 11 mmHg.  There was  mild aortic root dilatation.  His left ventricular ejection fraction was 40-  50% with mild septal hypokinesis.  The patient did well initially but then  developed progressive shortness of breath with exertion.  A repeat  echocardiogram on 07/22/02 showed that the left ventricle was mildly dilated  with an ejection fraction of 50-55%.  There was mild aortic stenosis and  moderate aortic regurgitation at 2-3+.  His mean aortic valve gradient was  11.4.  A repeat  cardiac catheterization on 07/14/02 showed normal right and  left heart pressures.  There was no measured gradient across the aortic  valve.  There was no mitral valve gradient.  Cardiac index was 1.9 by  thermodilution and 1.6 by FICK.  There was mild global hypokinesis with an  ejection fraction of about 51%.  There was moderate dilatation of the left  ventricle.  There was 3+ moderate to severe aortic insufficiency.  Coronary  angiography showed the LAD to be diffusely diseased in its proximal portion  with about 30-40% midvessel stenosis.  The left circumflex had insignificant  disease.  The right coronary artery was a dominant vessel that had 30%  proximal stenosis.  There was about 30% proximal stenosis before the stent  in the proximal vessel, which was widely patent.  Just distal to the stent  there was about  40% stenosis.  In the midvessel there was a stent that was  widely patent, followed by 30% stenosis beyond the stent.  Just after this  30% stenosis, there was another stent in the midvessel that was widely  patent.  After review of the angiogram and examination of the patient, it  was felt that aortic valve replacement with a pericardial valve would be the  best choice for this patient.  I also felt that it probably would be best to  perform coronary bypass to the LAD and right coronary artery since these  vessels were diffusely diseased and the patient was status post recent stent  placement in both vessels.  I discussed the operative procedure of aortic  valve replacement and coronary bypass surgery with the patient and his wife  and sons.  I discussed alternatives, benefits, and risks, including  bleeding, possible blood transfusion, infection, stroke, myocardial  infarction, and death.  They understood and agreed to proceed.   DESCRIPTION OF PROCEDURE:  The patient was taken to the operating room and  placed on the table in the supine position.  After induction of general   endotracheal anesthesia, a Foley catheter was placed in the bladder using  sterile technique.  Then the chest, abdomen, and both lower extremities were  prepped and draped in the usual sterile manner.  The chest was entered  through a median sternotomy incision and the pericardium opened in the  midline.  Examination of the heart showed good ventricular contractility.  The ascending aorta had no palpable plaques in it.   Then the left internal mammary artery was harvested from the chest wall as a  pedicle graft.  This was a large-caliber vessel with excellent blood flow  through it.  At the same time a segment of greater saphenous vein was  harvested from the right lower leg, and this vein was of medium size and  good quality.   Then the patient was heparinized and when an adequate activated clotting  time was achieved, the distal ascending aorta was cannulated using a 20  French aortic cannula for arterial inflow.  Venous outflow was achieved  using a two-stage venous cannula through the right atrial appendage.  An  antegrade cardioplegia and vent cannula was inserted in the aortic root.   The patient was placed on cardiopulmonary bypass and the distal coronaries  identified.  The LAD was diffusely diseased in its proximal portion but was  graftable in the midportion.  The right coronary artery was also diffusely  diseased in its proximal and midportion but was graftable distally just  before the posterior descending branch.  Then a left ventricular vent was  placed through the right superior pulmonary vein and a retrograde  cardioplegia cannula was inserted through the right atrium into the coronary  sinus.   The aorta was crossclamped and 300 cc of cold blood retrograde cardioplegia  was administered, followed by 500 cc of cold blood retrograde cardioplegia.  Additional doses of retrograde cardioplegia were given at approximately 20- 30 minute intervals to maintain myocardial  temperature around 10 degrees  Centigrade.  Systemic hypothermia to 20 degrees Centigrade and topical  hypothermia with iced saline was used.  A temperature probe was placed in  the septum and an insulating pad in the pericardium.   The first distal anastomosis was performed to the distal right coronary  artery.  The internal diameter was about 2 mm.  The conduit used was a  segment of greater saphenous  vein and the anastomosis performed in an end-to-  side manner using continuous 7-0 Prolene suture.  Flow was measured through  the graft and was excellent.   The second distal anastomosis was performed to the left anterior descending  coronary artery.  The internal diameter was about 2.5 mm.  The conduit used  was the left internal mammary graft, and this was brought out through an  opening in the left pericardium anterior to the phrenic nerve.  It was  anastomosed to the LAD in an end-to-side manner using continuous 8-0 Prolene  suture.  The pedicle was tacked to the epicardium with 6-0 Prolene sutures.  The anastomosis was checked for hemostasis.   Then attention was turned to aortic valve replacement.  The aorta was opened  transversely at the level of the sinotubular junction.  Examination of the  native valve showed that it was a three-leaflet valve.  There was marked  contraction of the right coronary cusp with inability to coapt with the  other two leaflets.  There was some annular calcification.  The native valve  was excised, and the annulus was decalcified using rongeurs.  Care was taken  to remove all particulate debris.  The aortic root and left ventricle were  irrigated with iced saline solution.  The annulus was sized and a 25 mm  pericardial valve was chosen.  Then a series of pledgeted 2-0 Ethibond  horizontal mattress sutures were placed with the pledgets in the subannular  position.  The sutures were placed through the sewing ring and the valve  lowered into place.  The  sutures were tied sequentially.  The valve seated  nicely.  Then the aortic root was closed in two layers using continuous 4-0  Prolene suture.   With the aortic crossclamp in place, a single proximal vein graft  anastomosis was performed to the aortic root in an end-to-side manner using  continuous 6-0 Prolene suture.  Then the left side of the heart was de-  aired.  The clamp was removed from the mammary pedicle.  There was rapid  rewarming of the ventricular septum and the return of spontaneous  ventricular fibrillation.  The crossclamp was removed with a time of 134  minutes.  The patient's head was placed in the Trendelenburg position prior  to removing the crossclamp.  The patient was defibrillated into sinus  rhythm.   The proximal and distal anastomoses appeared hemostatic and the alignment of  the grafts satisfactory.  Graft markers were placed around the proximal anastomosis.  Two temporary right ventricular and right atrial pacing wires  were placed and brought out through the skin.   When the patient had rewarmed to 37 degrees Centigrade, he was weaned from  cardiopulmonary bypass on low-dose dopamine.  Total bypass time was 134  minutes.  Cardiac function appeared excellent with a cardiac output of 6  L/min.  Transesophageal echocardiogram was performed and showed a normal-  functioning pericardial valve prosthesis.  There was no evidence of  perivalvular leak or regurgitation.  There was no significant mitral  regurgitation.  Left ventricular function appeared well-preserved.  Then the  patient was given protamine and the venous and aortic cannulas were removed  without difficulty.  Hemostasis was achieved.  Three chest tubes were placed  with two in the posterior pericardium, one in the left pleural space, and  one in the anterior mediastinum.  The pericardium was reapproximated over  the heart.  The sternum was closed with #6 stainless  steel wires.  The  fascia was  closed with continuous #1 Vicryl suture.  The subcutaneous tissue  was closed with continuous 2-0 Vicryl and the skin with 3-0 Vicryl  subcuticular closure.  The lower extremity vein harvest site was closed in  layers in a similar manner.  The sponge, needle, and instrument counts were  correct according to the scrub nurse.  Dry sterile dressings were applied  over the incisions and around the chest tubes, which were hooked to  Pleuravac suction.  The patient remained hemodynamically stable and was  transported to the SICU in guarded but stable condition.                                                Arian Murley DICTATOR    DD/MEDQ  D:  08/10/2002  T:  08/13/2002  Job:  [id number]   cc:   [facility]   [facility]

## 2011-05-09 NOTE — Discharge Summary (Signed)
Alexander [name], Alexander [name]                   ACCOUNT NO.:  [id number]   MEDICAL RECORD NO.:  [id number]          PATIENT TYPE:  OIB   LOCATION:  6524                         FACILITY:  [facility]   PHYSICIAN:  Duke Salvia, M.D.  DATE OF BIRTH:  1918/05/17   DATE OF ADMISSION:  07/04/2005  DATE OF DISCHARGE:  07/05/2005                                 DISCHARGE SUMMARY   DISCHARGE DIAGNOSES:  1.  Discharging day #1, status post implantation of Guidant Insignia I Ultra      DR Model 1291 dual-chamber pacemaker.  2.  Discharging day #1, status post atrial lead repositioning by Dr. Sherryl Manges.  3.  Second-degree arteriovenous block.  4.  First-degree arteriovenous block.  5.  Left bundle branch block.  6.  Chronotropic incompetence.  Pacer to reestablish AV synchrony.   SECONDARY DIAGNOSES:  1.  History of coronary artery disease, status post stenting in the right      coronary artery and the left anterior descending coronary artery.  2.  Aortic insufficiency, status post aortic valve replacement in 2003 with      finding of patent stents and preoperative catheterization.  3.  Class 2-3 congestive heart failure.  4.  MUGA scan:  Ejection fraction of 57% on May 26, 2005.  5.  Obstructive sleep apnea on oxygen and CPAP nocturnally.  6.  Hypothyroidism.   PROCEDURE:  1.  On July 04, 2005, implantation of Guidant Gar Ponto DR Model 1291      dual-chamber pacemaker.  2.  Necessity for atrial lead repositioning on July 04, 2005 by Dr. Sherryl Manges for atrial lead displacement.   DISCHARGE DISPOSITION:  Alexander [name] was discharged on day #1 after implantation  of Guidant pacemaker.  He has had no cardiac dysrhythmias in the  postoperative period.  He has had no exquisite chest pain.  His incisional  pain was controlled with oral analgesia, and his incision looks well.  It is  healing nicely.  There is no erythema.  No drainage.  No swelling at the  pacer pocket.   DISCHARGE  MEDICATIONS:  1.  Synthroid 50 mcg daily.  2.  Tylenol 325 mg 1-2 tablets every 4-6 hours as needed.  3.  Aleve 20 mg daily.  4.  Enteric-coated aspirin 81 mg daily.  5.  Xanax 0.25 mg daily.   He is asked to keep his incision dry for the next 7 days and sponge-bathe  until Friday, July 21.  He is asked not to drive for the next seven days.  His followup will be at [facility] at [address]  He  will be seen in the pacer clinic in two weeks and with Dr. Graciela Husbands in three  months.  Dr. Odessa Fleming office will call with both of these appointments.  Mobility in the left upper extremity has been described to the patient.   BRIEF HISTORY:  Alexander [name] is an 77 year old male who had initially been seen  for cardiac resynchronization therapy with an  implantable cardioverter  defibrillator implantation; however, assessment of the ejection fraction  demonstrated 57% by MUGA scan.  He has profound first-degree AV block with  left bundle branch block.  The issue now is whether reestablishing AV  synchrony will improve symptoms, which include chronic trophic incompetence,  fatigue, and dyspnea.  There is a good chance that this will be so;  therefore, the risks and benefits of implanting a pacemaker have been  described to the patient.  He will present electively to have this done.   HOSPITAL COURSE:  Patient presented electively on July 04, 2005.  He  underwent implantation of Guidant Insignia dual-chamber pacemaker Model 1291  on the same day.  It was found immediately after the procedure that his  right atrial lead had become dislodged.  He was taken back to the  electrophysiology lab for atrial lead repositioning.  This was done without  difficulty.  The patient has had no cardiac dysrhythmias or respiratory  distress after the implantation.  His incision looks well.  His chest x-ray  shows leads in appropriate position.  Pacemaker has been interrogated.  All  values within normal  limits.  Mobility of the left upper extremity has been  explained to the patient.  We will discharge July 05, 2005.      Debbora Lacrosse   GM/MEDQ  D:  07/04/2005  T:  07/05/2005  Job:  [id number]   cc:   Macarthur Critchley. Shelva Majestic, M.D.  [address] [place]  [address]  [place]  [place] [address]  Fax: [phone]   Billie Lade, M.D.  [address] - [facility] [place]  [place]  [place] [address]  Fax: [phone]

## 2011-05-09 NOTE — Cardiovascular Report (Signed)
[place]. [facility]  Patient:    Alexander, [name] Visit Number: [id number] MRN: [id number]          Service Type: CAT Location: 6500 [id number] Attending Physician:  Veneda Melter Dictated by:   Veneda Melter, M.D. [facility] Proc. Date: 05/09/02 Admit Date:  05/09/2002 Discharge Date: 05/10/2002   CC:         Dorothea Ogle, M.D., [facility]  Roney Marion, M.D.   Cardiac Catheterization  PROCEDURE:  Right heart catheterization.  Left heart catheterization.  Left ventriculogram.  Selective coronary angiography.  PTCA and stent placement in the proximal right coronary artery.  Primary stent x2 to the mid right coronary artery.  PTCA and stent placement to the mid LAD.  DIAGNOSES: 1. Severe two vessel coronary artery disease. 2. Mild left ventricular systolic dysfunction.  INDICATIONS:  Alexander [name] is an 77 year old male with a prior history of percutaneous intervention in 1991 as well as diabetes mellitus, dyslipidemia, and family history, who presents with chest discomfort on exertion, increasing shortness of breath, and dyspnea on exertion.  The patient underwent an echocardiogram suggesting well preserved LV function with moderate aortic insufficiency while cardiac studies showed at least moderate to severe LV dysfunction with no evidence of ischemia.  Due to symptoms suggestive of coronary ischemia and congestive heart failure, the patient is referred for further assessment.  DESCRIPTION OF PROCEDURE:  Informed consent was obtained.  The patient was brought to the catheterization lab.  A 6 French arterial and 8 French venous sheath were placed in the right groin using modified Seldinger technique. A 7.5 Jamaica PA catheter was advanced into the pulmonary artery and appropriate right-sided hemodynamics were obtained.  A 6 French pigtail catheter was then advanced to the left ventricle and appropriate left-sided hemodynamics were obtained.  A left ventriculogram was  performed using power injections of contrast.  A 6 Jamaica JL4 and JR4 catheters were then used to engage the left and right coronary arteries and selective angiography performed in various projections using manual injections of contrast.  FINDINGS:  RIGHT HEART CATHETERIZATION:  RA equals 10/4, mean equals 4.  RV equals 28/2. PA equals 27/10.  Pulmonary capillary wedge pressure 9/6, mean equals 6. Cardiac output is 4.5 liters per minute with cardiac index of 2.0 liters per minute per square meter by thermodilution.  Cardiac output and index were 3.3 and 1.5, respectively, by Fick method.  PA saturation was 53%, aortic saturation was 95%, hemoglobin was 13.5 mg/dl.  Mitral valve gradient was 1.1 mmHg.  LEFT HEART CATHETERIZATION: 1. Left main trunk.  Large caliber vessel with mild narrowings of 10 to 20%    in the proximal to mid section.  There was a distal taper of 30%. 2. LAD.  This begins as a large caliber vessel and provides two diagonal    branches in the midsection.  The LAD has mild disease of 30% in the    proximal segment.  There is a high grade focal narrowing of 70% in the    midsection prior to the second diagonal branch.  The distal LAD has    moderate irregularities.  The diagonal branches have mild disease of 30%. 3. Left circumflex artery.  This is a large caliber vessel that consists of    a large marginal branch in the midsection.  There is mild diffuse disease    of 30% in the left circumflex system. 4. Right coronary artery is dominant.  This is a large caliber vessel that  provides posterior descending artery and posterior ventricular branch at    the terminal segment.  The right coronary artery has a high grade narrowing    of 90% in the proximal segment prior to the takeoff of a RV marginal    branch.  The distal vessel is underfilled.  It will later be seen to have    sequential narrowings of 70 and 80% in the midsection of the vessel. 5. LV.  Mildly dilated  end systolic and end diastolic dimensions.  Overall    left ventricular function is mildly impaired.  Ejection fraction is    approximately 45%.  There is mild hypokinesis of the distal anterior and    apical walls.  No mitral regurgitation is appreciated.  HEMODYNAMIC DATA:  Left ventricular pressure is 145/5, aortic 137/55, LVEDP equals 12.  With these findings, we elected to proceed with percutaneous intervention to the right coronary artery and to the LAD and to medically treat the patients valvular disease.  PERCUTANEOUS INTERVENTION:  The patient was given heparin and Integrilin on a weight-adjusted basis to maintain ACT of approximately 250 seconds.  The 6 French sheath in the right groin was exchanged for a 7 Jamaica sheath and a 7 Jamaica JR4 guide catheter used to engage the right coronary artery.  A 0.014 inch Forte wire was advanced into the distal RCA.  This wire was used to guage the lesion length and vessel diameter.  A 3.25 x 12 mm Quantum Maverick balloon was introduced and used to predilate the lesion at 10 atmospheres for 30 seconds.  Repeat angiography showed significant improvement in vessel lumen and unmasked the severe lesions in the mid RCA.  A 3.5 x 12 mm Express stent was introduced and deployed in the proximal RCA extending up to the RV marginal branch at 14 atmospheres for 30 seconds.  Angiography showed an excellent result with full coverage of the lesion, no residual stenosis. There was moderate disease following the stented segment of 50%.  The mid right coronary artery then had narrowing of 70% followed by further narrowing of 80%. The distal vessel had mild irregularities.  A 3.5 x 12 mm Express II stent was introduced and carefully positioned in the mid RCA in the distal lesion and deployed at 16 atmospheres for 60 seconds.  A second 3.5 x 12 mm Express II stent was introduced and deployed in the mid RCA to the proximal  lesion at 16 atmospheres for 30  seconds.  Due to excessive tortuosity and a separation of the two lesions, it was not felt that a single stent would cover both lesions.  Repeat angiography was then performed after the administration of intracoronary nitroglycerin as well as intracoronary Verapamil showing excellent result with no residual stenosis, TIMI grade 3 flow through the right coronary artery.  The patient noted significant improvement in his breathing following the procedure.  Attention was then turned to the LAD and a 7 Jamaica Voda left 3.5 guide catheter used to engage the left coronary artery.  The Forte wire was advanced into the distal LAD and selective guide shots obtained.  A 3.0 x 12 mm Express II stent was introduced and carefully positioned in the mid LAD to cover the lesion and deployed at 14 atmospheres for 45 seconds.  Repeat angiography showed an excellent result, full coverage of the lesion, only mild residual disease.  The 3.25 x 12 mm Quantum Maverick balloon was reintroduced and used to postdilate the stent at 16 atmospheres for  45 seconds.  Angiography after administration of intracoronary nitroglycerin showed an excellent result with no residual stenosis, full coverage of the lesion and TIMI 3 flow through the LAD.  There was no evidence of distal vessel damage.  The guide catheter was then removed and the sheath secured in position.  The patient was then transferred to the floor in stable condition.  He tolerated the procedure well.  FINAL RESULTS: 1. Successful PTCA and stent placement in the proximal right coronary artery    with reduction of 90% narrowing to 0% with placement of 3.5 x 12 mm Express    II stent. 2. Successful primary stent placement to the mid right coronary artery with    reduction of sequential 70 and 80% narrowings to 0% with placement of a    3.5 x 12 mm Express II stent followed by an additional 3.5 x 12 mm Express    II stent. 3. Successful primary stent placement  and angioplasty to the mid LAD with    reduction of 70% narrowing to 0% with placement of a 3.0 x 12 mm Express    II stent dilated to 3.3 mm.  ASSESSMENT:  Alexander [name] is an 77 year old gentleman with advanced two vessel coronary artery disease and moderate aortic insufficiency.  He has only mild LV dysfunction.  Further assessment of the patients aortic insufficiency will be made with an echocardiogram, however, I believe that this may be medically managed.  He will continue on Plavix for one months time and risk factor modification persued. Dictated by:   Veneda Melter, M.D. [facility] Attending Physician:  Veneda Melter DD:  05/09/02 TD:  05/11/02 Job: [id number]

## 2011-05-09 NOTE — Cardiovascular Report (Signed)
[place]. [facility]  Patient:    [name], [name]. Visit Number: [id number] MRN: [id number]          Service Type: Attending:  Daisey [name], M.D. [facility] Dictated by:   Alexander [name], M.D. [facility] Proc. Date: 07/14/02   CC:         Alexander [name], M.D.  Alexander [name], M.D.  Cardiac Catheterization Laboratory   Cardiac Catheterization  PROCEDURES PERFORMED: Right and left heart catheterization with coronary angiography, left ventriculography, and aortic root angiography.  INDICATIONS: The patient is an 77 year old male with a history of aortic valvular disease. In May of this year he underwent cardiac catheterization by Dr. Chales [name] which revealed no significant aortic stenosis and normal right heart pressures. He had mildly decreased left ventricular systolic function at that time. He had significant two-vessel coronary artery disease with critical disease in the right coronary artery and left anterior descending artery. He underwent stent placement x3 in the right coronary artery and stent placement x1 in the left anterior descending artery. Since then, he has continued to experience symptoms of exertional dyspnea. He was thus referred for repeat cardiac catheterization. Of note, an echocardiogram obtained the day after the percutaneous coronary intervention revealed moderate dilatation of the left ventricle with moderate to severe aortic regurgitation.  DESCRIPTION OF PROCEDURE: An 8 French sheath was placed in the right femoral vein, 7 French sheath in the right femoral artery. Right heart catheterization was performed with a Swan-Ganz catheter. Left heart catheterization was performed with Standard Judkins 6 French catheters. The gradient across the aortic valve was measured by both simultaneous left ventricular versus femoral artery pressures as well as on aortic valve pullback. We also obtained simultaneous left ventricular and end-diastolic pressure  and pulmonary capillary wedge pressures to rule out mitral stenosis. Contrast was Omnipaque.  There were no complications. RESULTS:  HEMODYNAMICS: Right atrial mean pressure of 6, right ventricle pressure 29/4, pulmonary artery pressure 29/12, pulmonary capillary mean pressure 13, left ventricular pressure 152/12, aortic pressure 152/58. There was no gradient measured across the aortic valve. There was also no appreciable gradient across the mitral valve as assessed by simultaneous measurements of the pulmonary capillary wedge pressure in the left ventricular end-diastolic pressure.  Cardiac output by the thermodilution method is 4.2, cardiac index 1.9. Cardiac output by the Fick method is 3.4, cardiac index 1.6.  LEFT VENTRICULOGRAM: There was mild global hypokinesis of the left ventricle with moderate dilatation of the left ventricle. Ejection fraction calculated at 51%. There is trace mitral regurgitation.  Aortic root angiography reveals a tricuspid aortic valve with mild calcification. There is mild dilatation of the ascending aorta. There is 3+ moderate to severe aortic insufficiency.  CORONARY ARTERIOGRAPHY: (Right dominant).  Left main is normal.  Left anterior descending has a 30% stenosis in the mid vessel. Further down in the mid vessel there is a stent which is widely patent with 0% stenosis at the stent site. The distal vessel has a 30% stenosis. The LAD gives rise to a small first diagonal and a normal sized second diagonal.  Left circumflex has a 30% stenosis at its origin followed by a diffuse 20% stenosis in the mid vessel extending into the proximal portion of a large third obtuse marginal branch. The circumflex gives rise to a small OM-1, small OM-2 and a large OM-3.  The right coronary artery is a dominant but relatively small vessel. There is a 30% stenosis in the proximal vessel followed by a stent in  the proximal vessel which is widely patent. Just  distal to the stent there is a 40% stenosis. In the mid vessel there is stent which is widely patent followed by a 30% stenosis beyond the stent. Just after this 30% stenosis there is a second stent in the mid vessel which is also widely patent. The distal right coronary artery gives rise to a small posterior descending artery and two small posterolateral branches.  IMPRESSIONS: 1. Normal right and left heart filling pressures with normal pulmonary    artery pressures. 2. Mildly decreased left ventricular systolic function with moderately    decreased cardiac index. 3. Moderate to severe aortic insufficiency. 4. Patent stents in the left anterior descending artery and the    right coronary artery. There is mild but nonobstructive residual    coronary artery disease.  PLAN: These findings will be discussed with Dr. Tomie [name]. It appears that the patients dyspnea is most likely related to his aortic insufficiency. Given the degree of aortic insufficiency, mild left ventricular dysfunction and moderate left ventricular dilatation, aortic valve replacement may be indicated. Dictated by:   Alexander [name], M.D. [facility] Attending:  Daisey [name], M.D. [facility] DD:  07/14/02 TD:  07/19/02 Job: [id number]

## 2011-05-09 NOTE — H&P (Signed)
NAME:  Alexander [name], Alexander [name]                             ACCOUNT NO.:  [id number]   MEDICAL RECORD NO.:  [id number]                   PATIENT TYPE:  INP   LOCATION:  2001                                 FACILITY:  [facility]   PHYSICIAN:  Thad Ranger McNeer                      DATE OF BIRTH:  1918-01-23   DATE OF ADMISSION:  07/20/2002  DATE OF DISCHARGE:  07/22/2002                                HISTORY & PHYSICAL   CHIEF COMPLAINT:  Shortness of breath.   HISTORY OF PRESENT ILLNESS:  The patient is a pleasant 77 year old male, who  underwent cardiac catheterization on July 14, 2002, for shortness of breath  and diaphoresis. Catheterization at that time revealed normal right and left  heart filling pressures and normal pulmonary artery pressures.  Left  ventricular systolic function was mildly decreased.  The existing stents in  the left anterior descending as well as right coronary arteries were patent.  Otherwise, there was mild nonobstructive residual coronary artery disease.  However, there was noted to be moderate to severe aortic insufficiency.  At  that time, Dr. Gerri Spore felt that the patient's dyspnea was likely  revealing aortic insufficiency.  He was discharged home to be followed by  Dr. Evelene Croon of [facility] in the first week of August.   Since that time, the patient described dyspnea with minimal exertion.  On  the day of admission, as he was leaving the shower, he became markedly  dyspneic and diaphoretic.  After lying down and resting, his symptoms  abated.  He then went to see Dr. Tomie China of [facility].  Dr.  Tomie China recommended admission to [facility]; however, the patient  elected to come to [facility] for evaluation.   PAST MEDICAL HISTORY:  1. Coronary artery disease with status post angioplasty and stenting to the     proximal as well as mid right coronary artery and to the mid left     anterior descending coronary artery.  Known aortic  insufficiency.  2. Left ventricular dysfunction with ejection fraction of 45% and mild     apical akinesis.  3. Hypertension.  4. Hyperlipidemia.  5. History of prostate cancer, receiving Lupron injections.  6. History of rheumatic fever as a child.  7. Osteoarthritis.  8. Gastroesophageal reflux disease.   PAST SURGICAL HISTORY:  1. Cardiac catheterization x 2.  2. Appendectomy and tonsillectomy.   FAMILY HISTORY:  The patient's mother died at 14 of a myocardial infarction.  His father died at age 70 from bladder cancer.   SOCIAL HISTORY:  The patient lives in [place] with his wife of 60 years.  He is a retired Music therapist.  He denies any tobacco or alcohol use.   ALLERGIES:  PLAVIX, SULFA.   CURRENT MEDICATIONS:  1. Toprol XL 25 mg p.o. q.d.  2. Digoxin 0.125 mg p.o. q.d.  3. Vioxx 25 mg p.o. q.d.  4. Lasix 40 mg p.o. q.d.  5. Lipitor 20 mg p.o. q.h.s.  6. Sublingual nitroglycerin as needed.   REVIEW OF SYSTEMS:  This is significant for shortness of breath and marked  dyspnea on exertion.  This patient also reports some presyncopal symptoms  when quickly arising from a seated position.  He also reports some symptoms  of reflux disease.  Otherwise, review of systems is unremarkable.   PHYSICAL EXAMINATION:   VITAL SIGNS:  :  Current temperature 97.3, heart rate 73, respiratory rate  30, blood pressure 106/40.   GENERAL:  :  This is an 77 year old Caucasian male, dyspneic.   HEENT:  :  Normocephalic, atraumatic.  Pupils are equal, round, reactive to  light and accommodation.  Extraocular movements are intact.   NECK:  :  Supple without lymphadenopathy, thyromegaly, bruit, jugular venous  distension.   CHEST:  :  Clear to auscultation bilaterally.   CARDIAC:  :  Regular rate and rhythm, normal S1 and S2.  No murmur of aortic  insufficiency or mitral regurgitation is appreciated.   ABDOMEN:  :  Soft and nontender with normal bowel sounds.  There is no  rebounding or  guarding and no hepatosplenomegaly.   EXTREMITIES:  :  There is clubbing, cyanosis, or edema noted.  Distal pulses  are 2+ in all extremities.   NEUROLOGIC:  :  The patient is awake, alert, and oriented x 3.  Cranial  nerves 2-12 are grossly intact.  There were no focal findings.   RADIOLOGY:  Chest x-ray shows no acute disease.   Electrocardiogram shows both history of junctional rhythm of 92, axis -64.  QRS 133, QTc 480.  There are no acute ischemic changes.   LABORATORY DATA:  White count 7.8, hemoglobin 13.4, hematocrit 38.8,  platelets 248.  Sodium 141, potassium 3.8, chloride 110, CO2 20, BUN 20,  creatinine 1.5, glucose 122.  Total CK 57, MB 1.7, troponin I 0.04, PTT 26,  PT 14.3, INR 1.1, BNP 49.6.   ASSESSMENT/PLAN:  1. Dyspnea.  It is unclear if this is the total cause of the patient's     dyspnea given no murmur is appreciated.  Change diuretics to IV.  2. Aortic insufficiency.  We will plan on having Dr. Laneta Simmers see this patient     while admitted.  3. Coronary artery disease.  This is stable by catheterization a week ago.     Recent echocardiogram and angiography films will be reviewed by Dr.     Corinda Gubler.  4. Hypertension.  5. Hyperlipidemia.  6. History of prostate cancer.  7. History of rheumatic heart disease.  8. Osteoarthritis.  9. Gastroesophageal reflux disease.                                                    Thad Ranger McNeer    CKM/MEDQ  D:  07/20/2002  T:  07/22/2002  Job:  [id number]   cc:   Barbera Setters. Lawana Pai, M.D.   Aundra Dubin. Revankar, M.D.   Luis Abed, M.D. [facility]

## 2011-05-09 NOTE — H&P (Signed)
[facility]  Patient:    Alexander [name], Alexander [name] Visit Number: [id number] MRN: [id number]          Service Type: CAT Location: [id number] Attending Physician:  Veneda Melter Dictated by:   Veneda Melter, M.D. [facility] Admit Date:  05/09/2002   CC:         Quita Skye. Artis Flock, M.D.  Aundra Dubin. Revankar, M.D.   History and Physical  CHIEF COMPLAINT: Chest pain.  HISTORY OF PRESENT ILLNESS: Mr. Krauser is an 77 year old gentleman with a history of percutaneous intervention in 1991, who recently presents with substernal chest discomfort and shortness of breath.  The patient was assessed in [place], [place] by Dr. Tomie China and underwent echocardiogram, showing moderate aortic insufficiency with well preserved LV function.  There was suggestion of anterior wall hypokinesis.  A stress Cardiolite on May 04, 2002 showed no ischemia but suggested severe LV dysfunction with dilated LV. The patient is referred for cardiac catheterization for further assessment of symptoms.  The patient notes that over the past several weeks he has had exertional tightness in his chest.  This is relieved by rest.  He also notes shortness of breath with exertion and when bending over.  He denies any rest pain.  No nausea or vomiting or associated symptoms.  PAST MEDICAL HISTORY:  1. Coronary artery disease, status post PTCA.  2. Hypertension.  3. Dyslipidemia.  4. History of prostate cancer.  He receives monthly injections.  5. Rheumatic fever as a child.  6. Osteoarthritis.  7. Status post appendectomy.  8. Status post tonsillectomy.  ALLERGIES: None.  CURRENT MEDICATIONS:  1. Cholestyramine.  2. Altace 10 mg q.d.  3. Aspirin 325 mg q.d.  4. Vioxx 25 mg q.d.  5. Megestrol AC 40 mg q.d.  6. Tylenol Arthritis as needed.  7. Zantac 75 mg q.d.  8. Lasix 20 mg q.d.  9. Digoxin 0.125 mg q.d. 10. The patient has also been on nifedipine and isosorbide in the past.  SOCIAL HISTORY:  The patient [place] in [place] his [place]. He is a retired Music therapist.  He denies alcohol or tobacco use.  FAMILY HISTORY: Mother died at the age of 77 of myocardial infarction.  Father died at the age of 71 from bladder cancer.  REVIEW OF SYSTEMS: Notable for gastroesophageal reflux disease and arthralgias in his legs.  PHYSICAL EXAMINATION:  GENERAL: He is a well-developed, well-nourished white male in no acute distress.  VITAL SIGNS: Temperature 97.4 degrees, blood pressure 161/93, pulse 77, respirations 20.  HEENT: PERRL.  EOMI.  Oropharynx shows no lesions.  HEART: Regular rate, grade 2/6 systolic murmur at the base.  LUNGS: Clear to auscultation.  ABDOMEN: Soft, nontender.  EXTREMITIES: No peripheral edema.  Peripheral pulses are 2+ and equal bilaterally.  LABORATORY DATA: Chest x-ray on May 04, 2002 in [place], [place] shows normal cardiac silhouette with chronic-appearing hyperinflation.  ECG is normal sinus at 84, first degree block; no acute changes.  WBC 6.7, hemoglobin 13.5, hematocrit 39.2; platelets 267,000.  Sodium 136, potassium 4.3, chloride 106, bicarbonate 16, BUN 28, creatinine 1.3, glucose 110.  PTT 26, INR 1.2.  ASSESSMENT/PLAN: Mr. Darley is an 77 year old gentleman, who presents with exertional chest discomfort and worsening shortness of breath to the point where minimal activity causes significant complaint.  He presents for further cardiac assessment.  The risks, benefits, and alternatives of cardiac catheterization and possible angioplasty were discussed with the patient and he wishes to proceed.  Dictated by:   Veneda Melter, M.D. [facility] Attending Physician:  Veneda Melter DD:  05/09/02 TD:  05/10/02 Job: [id number]

## 2011-05-09 NOTE — Op Note (Signed)
NAME:  Alexander [name], Alexander [name]                   ACCOUNT NO.:  [id number]   MEDICAL RECORD NO.:  [id number]          PATIENT TYPE:  OIB   LOCATION:  6524                         FACILITY:  [facility]   PHYSICIAN:  Duke Salvia, M.D.  DATE OF BIRTH:  03-21-1918   DATE OF PROCEDURE:  07/04/2005  DATE OF DISCHARGE:                                 OPERATIVE REPORT   PREOPERATIVE DIAGNOSIS:  Previously implanted pacemaker earlier in the day  with subsequent atrial lead dislodgement.   POSTOPERATIVE DIAGNOSIS:  Previously implanted pacemaker earlier in the day  with subsequent atrial lead dislodgement.   PROCEDURE:  Atrial lead repositioning.   Following obtaining informed consent, the patient was brought to the  electrophysiology laboratory and placed on the fluoroscopic table in a  supine position.  After routine prep and drape, local anesthesia was  administered along the line of the previous incision.  The incision was  opened and the sutures were removed.  The anchoring sutures for the atrial  lead and for the device were also removed.  The atrial screw was retracted  and was repositioned in the right atrial appendage remnant.  In the final  location, the bipolar P wave was 1.3 millivolts with a pacing impedance of  562 ohms, threshold 0.7 volts at 0.5 milliseconds, current threshold 1.4 MA.  With these acceptable parameters recorded, the lead was reattached to the  previously utilized [employer] DR, model 1291, pulse generator,  serial number [id number].  P-synchronous pacing was identified.  The pocket was  copiously irrigated with antibiotic containing saline solution.  Hemostasis  was assured.  The leads and pulse generator were placed back in the pocket  and secured to the prepectoral fascia.  The wound was closed in three layers  in a normal fashion.  The wound was washed, dried, and a Benzoin and Steri-  Strip dressing was applied.  Needle counts, sponge counts, and instrument  counts were correct at the end of the procedure according to the staff.  The  patient tolerated the procedure without apparent complications.       SCK/MEDQ  D:  07/04/2005  T:  07/04/2005  Job:  [id number]   cc:   Aundra Dubin. Revankar, M.D.  [address]  [place]  [place] [address]  Fax: [phone]

## 2011-05-09 NOTE — Op Note (Signed)
NAMEERVING, SASSANO                   ACCOUNT NO.:  [id number]   MEDICAL RECORD NO.:  [id number]          PATIENT TYPE:  OIB   LOCATION:  2899                         FACILITY:  [facility]   PHYSICIAN:  Duke Salvia, M.D.  DATE OF BIRTH:  04-04-1918   DATE OF PROCEDURE:  07/04/2005  DATE OF DISCHARGE:                                 OPERATIVE REPORT   PREOPERATIVE DIAGNOSIS:  Trifascicular block with resultant bradycardia and  functional intolerance.   POSTOPERATIVE DIAGNOSIS:  Trifascicular block with resultant bradycardia and  functional intolerance.   PROCEDURE:  Dual chamber pacemaker implantation.   Following the obtaining of informed consent, the patient was brought to the  electrophysiology laboratory and placed on the fluoroscopic table in supine  position.  After routine prep and drape of the left upper chest, lidocaine  was infiltrated in the prepectoral subclavicular region.  Incision was made  and carried down to the layer of prepectoral fascia using electrocautery and  sharp dissection.  A pocket was performed similarly and hemostasis was  obtained.   Thereafter, attention was turned to gaining access to the extrathoracic left  subclavian vein which was accomplished without difficulty and without the  aspiration of air. The artery was, however, punctured on one occasion.  Pressure was held.  Subsequently a double wire technique was used and  sequentially 7 [place] tear away introducer sheaths were placed through which  were then passed sequentially a Medtronic 5076 58 cm active fixation  ventricular lead, serial number [id number] and a Medtronic 5076 52 cm active  fixation atrial lead, serial number [id number].  The ventricular lead was  manipulated into the right ventricular apex marked with a tie where the  bipolar R-wave was 9.9 mV with a pacing impedence of 1113 ohms and threshold  of 1 volt at 0.5 msec.  There was no diaphragmatic pacing at 10 volts.   The bipolar  P-wave was 1.3 mV with a pacing impedence of 509 ohms, threshold  of 0.7 volts at 0.5 msec.  Currented thresholds were calculated but I do not  have them in front of me.  These leads were then secured to the prepectoral  fascia and attached to a Guidant Insignia Ultra DR 1291 pulse generator,  serial number [id number].  Ventricular pacing and then P synchronous pacing were  identified.  The pocket was copiously irrigated with antibiotic-containing  saline solution.  Hemostasis was assured and the leads and the pulse  generator were then placed in the pocket and secured to the prepectoral  fascia. The wound was closed in three layers in the normal  fashion.  The wound was washed, dried and Benzoin and Steri-Strip dressing  was applied.  Sponge, needle and instrument counts were correct at the end  of the procedure according to the staff.  The patient tolerated the  procedure without apparent complications.       SCK/MEDQ  D:  07/04/2005  T:  07/04/2005  Job:  [id number]   cc:   Macarthur Critchley. Shelva Majestic, M.D.  [address] [place]  [address]  [place] [address]  Fax: [phone]   Aundra Dubin. Revankar, M.D.  [address]  [place]  [place] [address]  Fax: [phone]   EP Lab   [facility]

## 2011-05-09 NOTE — Op Note (Signed)
NAME:  Alexander [name], Alexander [name]                             ACCOUNT NO.:  [id number]   MEDICAL RECORD NO.:  [id number]                   PATIENT TYPE:  INP   LOCATION:  2011                                 FACILITY:  [facility]   PHYSICIAN:  Alleen Borne, M.D.               DATE OF BIRTH:  January 01, 1918   DATE OF PROCEDURE:  08/10/2002  DATE OF DISCHARGE:                                 OPERATIVE REPORT   PREOPERATIVE DIAGNOSIS:  Postoperative mediastinal bleeding, status post  aortic valve replacement and coronary bypass surgery.   POSTOPERATIVE DIAGNOSIS::  Postoperative mediastinal bleeding, status post  aortic valve replacement and coronary bypass surgery.   PROCEDURE:  Exploration of mediastinum and cauterization of sternal bleeding  sites.   SURGEON:  Alleen Borne, M.D.   ANESTHESIA:  General endotracheal.   CLINICAL HISTORY:  This patient is an 77 year old gentleman who is a few  hours postop after aortic valve replacement and coronary artery bypass  surgery.  Good hemostasis was achieved at the conclusion of that procedure,  and the patient was returned to the intensive care unit.  Over the next  three hours he had progressively increasing chest tube output to a maximum  of about 430 cc for an hour.  The following hour he put out 300 cc from the  chest tubes.  This was bright red, clotting blood.  I felt that most likely  there was some bleeding from the undersurface of the sternum and that this  would require exploration to control it.  He remained hemodynamically  stable, and I decided to return him to the operating room to stop this  bleeding.  The patient's family had already gone home and were en route to  their home and could not be reached by telephone, but a message was left at  their house.   DESCRIPTION OF PROCEDURE:  The patient was taken to the operating room and  placed on the table in the supine position.  After the patient was  positioned on the table and general  endotracheal anesthesia was administered  by anesthesiology, the chest was prepped with Betadine soap and solution and  draped in the usual sterile manner.  The previous median sternotomy incision  was opened and the sternal wires removed.  There was some fresh blood and  clots present in the mediastinum.  The chest tubes were declotted.  All the  mediastinal blood was removed.  The cannulation sites were all examined and  hemostatic.  It was noted that there was one bleeding site on the right side  of the sternum along the posterior edge of the sternum where there was  bright red blood pumping from a tiny arterial site.  This was cauterized.  The distal anastomoses were examined and were hemostatic.  The proximal  anastomosis was hemostatic.  No other bleeding sites were seen.  The  mediastinum  were irrigated with saline solution.  There appeared to be  complete hemostasis.  The chest tubes were returned to their normal  position.  The pericardium was reapproximated over the heart.  The sternum  was closed with #6 stainless steel wires.  The fascia was closed with  continuous #1 Vicryl suture.  The subcutaneous tissue was closed with  continuous 2-0 Vicryl and the skin with 3-0 Vicryl subcuticular closure.  The sponge, needle, and instrument counts were correct according to the  scrub nurse.  Dry sterile dressings were applied over the incision and around the chest  tubes, which were hooked to Pleuravac suction.  The patient remained  hemodynamically stable and was transported to the surgical intensive care  unit in guarded but stable condition.                                                 Alleen Borne, M.D.    BKB/MEDQ  D:  08/10/2002  T:  08/13/2002  Job:  [id number]

## 2011-08-19 ENCOUNTER — Encounter: Payer: Self-pay | Admitting: *Deleted

## 2012-01-08 ENCOUNTER — Encounter: Payer: Self-pay | Admitting: *Deleted

## 2012-01-14 ENCOUNTER — Encounter: Payer: Self-pay | Admitting: Internal Medicine

## 2012-01-14 ENCOUNTER — Ambulatory Visit (INDEPENDENT_AMBULATORY_CARE_PROVIDER_SITE_OTHER): Payer: Medicare Other | Admitting: Internal Medicine

## 2012-01-14 VITALS — BP 110/52 | Ht 73.0 in | Wt 199.1 lb

## 2012-01-14 DIAGNOSIS — I509 Heart failure, unspecified: Secondary | ICD-10-CM

## 2012-01-14 DIAGNOSIS — I442 Atrioventricular block, complete: Secondary | ICD-10-CM | POA: Insufficient documentation

## 2012-01-14 DIAGNOSIS — I4891 Unspecified atrial fibrillation: Secondary | ICD-10-CM

## 2012-01-14 DIAGNOSIS — Z950 Presence of cardiac pacemaker: Secondary | ICD-10-CM

## 2012-01-14 DIAGNOSIS — I25810 Atherosclerosis of coronary artery bypass graft(s) without angina pectoris: Secondary | ICD-10-CM

## 2012-01-14 LAB — PACEMAKER DEVICE OBSERVATION
AL IMPEDENCE PM: 480 Ohm
ATRIAL PACING PM: 19
BRDY-0003RV: 120 {beats}/min
BRDY-0004RV: 130 {beats}/min
DEVICE MODEL PM: 124047
RV LEAD IMPEDENCE PM: 600 Ohm
RV LEAD THRESHOLD: 1 V

## 2012-01-14 NOTE — Assessment & Plan Note (Signed)
He has evidence of volume overload with his Jvd but interestingly has no edema. We'll obtain his left ventricular function assessment from his stress test and decide if there are medication options that might help ameliorate some of this. Augmentin his diuresis may make sense.

## 2012-01-14 NOTE — Assessment & Plan Note (Addendum)
He continues to have paroxysms of atrial fibrillation. His thromboembolic risk profile includes age hypertension and vascular disease and so Warfarin is appropriate. I will be in contact with Dr. Sheria Lang to make the final decision as his PCP.  At that point we would stop his aspirin

## 2012-01-14 NOTE — Assessment & Plan Note (Signed)
No evidence of angina. Await stress test results

## 2012-01-14 NOTE — Progress Notes (Signed)
  HPI  Alexander [name] is a 78 y.o. male seen infollowup for  t pacemaker implantation for syncope and trifascicular block complicated by atrial lead dislodgment.   He also has a history of bypass surgery and aortic valve replacement. We have no recent information on his left ventricular function. He is chronic shortness of breath. There has been no lightheadedness or syncope. He has mild peripheral edema. He underwent laparoscopic bowel surgery this fall. He was seen in consultation by [facility]. He apparently passed the test. We will get these data.  He is largely limited in his activity by his knees but also by his shortness of breath. He has had no peripheral edema and denies chest pain   Past Medical History  Diagnosis Date  . Atrial fibrillation   . Trifascicular block     pacemaker implant; [employer] 1610  . Pacemaker     [employer]  . CHF (congestive heart failure)     Past Surgical History  Procedure Date  . Pacemaker insertion     trifascicular block; [employer] 9604  . Aortic valve replacement   . Coronary artery bypass graft     Current Outpatient Prescriptions  Medication Sig Dispense Refill  . acetaminophen (TYLENOL) 500 MG tablet Take 500 mg by mouth every 6 (six) hours as needed.      Marland Kitchen aspirin 81 MG tablet Take 81 mg by mouth daily.       . digoxin (LANOXIN) 0.25 MG tablet Take 250 mcg by mouth daily.      . Folic Acid-Vit B6-Vit B12 (FOLGARD) 0.8-10-0.115 MG TABS Take by mouth.      . furosemide (LASIX) 40 MG tablet Take 40 mg by mouth daily.      . isosorbide mononitrate (IMDUR) 60 MG 24 hr tablet Take 60 mg by mouth daily.      Marland Kitchen levothyroxine (LEVOTHROID) 50 MCG tablet Take 50 mcg by mouth daily.      . metoprolol succinate (TOPROL-XL) 50 MG 24 hr tablet Take 50 mg by mouth daily. Take with or immediately following a meal.      . omeprazole (PRILOSEC) 20 MG capsule Take 20 mg by mouth daily.      .  polysaccharide iron (NIFEREX) 150 MG CAPS capsule Take 150 mg by mouth. M, W, F      . potassium chloride SA (K-DUR,KLOR-CON) 20 MEQ tablet Take 20 mEq by mouth daily.        Allergies  Allergen Reactions  . Plavix (Clopidogrel Bisulfate)     Review of Systems negative except from HPI and PMH  Physical Exam BP 110/52  Ht 6\' 1"  (1.854 m)  Wt 199 lb 1.9 oz (90.32 kg)  BMI 26.27 kg/m2 Well developed and well nourished in no acute distress HENT normal E scleral and icterus clear Neck Supple JVP 10 cm carotids brisk and full Clear to ausculation Regular rate and rhythm, 2/6 murmur along the left lower sternal border gallops or rub Soft with active bowel sounds No clubbing cyanosis none Edema Alert and oriented, grossly normal motor and sensory function Skin Warm and Dry   Assessment and  Plan

## 2012-01-14 NOTE — Patient Instructions (Signed)
Your physician wants you to follow-up in: 6 months with Alexander [name]/ Alexander [name] & 1 year with Dr. Klein. You will receive a reminder letter in the mail two months in advance. If you don't receive a letter, please call our office to schedule the follow-up appointment.  Your physician recommends that you continue on your current medications as directed. Please refer to the Current Medication list given to you today.  

## 2012-01-14 NOTE — Assessment & Plan Note (Signed)
Stable post pacing 

## 2012-01-14 NOTE — Assessment & Plan Note (Signed)
The patient's device was interrogated.  The information was reviewed. No changes were made in the programming.    

## 2012-01-16 ENCOUNTER — Telehealth: Payer: Self-pay | Admitting: Internal Medicine

## 2012-01-16 NOTE — Telephone Encounter (Addendum)
ROI faxed to [facility] @ [phone], to Obtain Records for Dr.Klein 01/16/12/KM  Records Received from [facility] gave to [facility] 01/16/12/Km

## 2012-01-30 ENCOUNTER — Telehealth: Payer: Self-pay | Admitting: Internal Medicine

## 2012-09-22 ENCOUNTER — Encounter: Payer: Self-pay | Admitting: *Deleted

## 2012-09-27 ENCOUNTER — Ambulatory Visit (INDEPENDENT_AMBULATORY_CARE_PROVIDER_SITE_OTHER): Payer: Medicare Other | Admitting: *Deleted

## 2012-09-27 DIAGNOSIS — I442 Atrioventricular block, complete: Secondary | ICD-10-CM

## 2012-09-27 LAB — PACEMAKER DEVICE OBSERVATION
AL IMPEDENCE PM: 500 Ohm
ATRIAL PACING PM: 0
DEVICE MODEL PM: 124047
RV LEAD IMPEDENCE PM: 620 Ohm
VENTRICULAR PACING PM: 99

## 2012-09-27 NOTE — Progress Notes (Signed)
PPM check 

## 2012-12-30 ENCOUNTER — Encounter: Payer: Self-pay | Admitting: Internal Medicine

## 2013-03-22 ENCOUNTER — Encounter: Payer: Self-pay | Admitting: Internal Medicine

## 2013-03-22 ENCOUNTER — Ambulatory Visit (INDEPENDENT_AMBULATORY_CARE_PROVIDER_SITE_OTHER): Payer: Medicare Other | Admitting: Internal Medicine

## 2013-03-22 VITALS — BP 124/70 | HR 70 | Ht 73.0 in | Wt 185.7 lb

## 2013-03-22 DIAGNOSIS — I442 Atrioventricular block, complete: Secondary | ICD-10-CM

## 2013-03-22 DIAGNOSIS — I4891 Unspecified atrial fibrillation: Secondary | ICD-10-CM

## 2013-03-22 DIAGNOSIS — Z950 Presence of cardiac pacemaker: Secondary | ICD-10-CM

## 2013-03-22 LAB — PACEMAKER DEVICE OBSERVATION
AL IMPEDENCE PM: 490 Ohm
BRDY-0002RV: 60 {beats}/min
BRDY-0004RV: 110 {beats}/min
DEVICE MODEL PM: 124047

## 2013-03-22 NOTE — Assessment & Plan Note (Signed)
The patient's device was interrogated.  The information was reviewed. No changes were made in the programming.    

## 2013-03-22 NOTE — Assessment & Plan Note (Signed)
Anticoagulation deferred 2/2 PCP decision

## 2013-03-22 NOTE — Progress Notes (Signed)
Patient Care Team: Desmond Dike as PCP - General (Unknown Physician Specialty)   HPI  Alexander [name] is a 79 y.o. male seen in followup for  pacemaker implantation for syncope and trifascicular block complicated by atrial lead dislodgment.  He also has a history of bypass surgery and aortic valve replacement.   .  When he was seen last year, there were discussions regarding anticoagulation for atrial fibrillation as well as treatment of his congestive heart failure  He has become increasingly immobile. He is now wheelchair-bound because of his knees. His [place]. It is rather depressing he says.     Past Medical History  Diagnosis Date  . Atrial fibrillation   . Trifascicular block     pacemaker implant; [employer] 1610  . Pacemaker     [employer]  . CHF (congestive heart failure)   . Ischemic cardiomyopathy     Past Surgical History  Procedure Laterality Date  . Pacemaker insertion      trifascicular block; [employer] 9604  . Aortic valve replacement      2004  . Coronary artery bypass graft      Current Outpatient Prescriptions  Medication Sig Dispense Refill  . acetaminophen (TYLENOL) 500 MG tablet Take 500 mg by mouth every 6 (six) hours as needed.      Marland Kitchen aspirin 81 MG tablet Take 81 mg by mouth daily.       . digoxin (LANOXIN) 0.25 MG tablet Take 250 mcg by mouth daily.      Tery Sanfilippo Calcium (STOOL SOFTENER PO) Take by mouth.      . Folic Acid-Vit B6-Vit B12 (FOLGARD) 0.8-10-0.115 MG TABS Take by mouth.      . furosemide (LASIX) 40 MG tablet Take 20 mg by mouth 2 (two) times daily.       . isosorbide mononitrate (IMDUR) 60 MG 24 hr tablet Take 120 mg by mouth daily.       Marland Kitchen levothyroxine (LEVOTHROID) 50 MCG tablet Take 50 mcg by mouth daily.      . metoprolol succinate (TOPROL-XL) 50 MG 24 hr tablet Take 50 mg by mouth daily. Take with or immediately following a meal.      . omeprazole (PRILOSEC) 20 MG capsule Take 20 mg by  mouth daily.      . polysaccharide iron (NIFEREX) 150 MG CAPS capsule Take 150 mg by mouth. M, W, F      . potassium chloride SA (K-DUR,KLOR-CON) 20 MEQ tablet Take 20 mEq by mouth daily.       No current facility-administered medications for this visit.    Allergies  Allergen Reactions  . Plavix (Clopidogrel Bisulfate)     Review of Systems negative except from HPI and PMH  Physical Exam BP 124/70  Pulse 70  Ht 6\' 1"  (1.854 m)  Wt 185 lb 11.2 oz (84.233 kg)  BMI 24.51 kg/m2 Sitting in a wheelchair  Well developed and nourished in no acute distress HENT normal Neck supple with JVP-flat Clear Regular rate and rhythm, no murmurs or gallops Abd-soft with active BS No Clubbing cyanosis edema Skin-warm and dry A & Oriented  Grossly normal sensory and motor function      Assessment and  Plan

## 2013-03-22 NOTE — Patient Instructions (Signed)
Your physician wants you to follow-up in: 6 months with Kristin/Paula in the device clinic & 1 year with Dr. Graciela Husbands. You will receive a reminder letter in the mail two months in advance. If you don't receive a letter, please call our office to schedule the follow-up appointment.  Your physician recommends that you continue on your current medications as directed. Please refer to the Current Medication list given to you today.

## 2013-03-22 NOTE — Assessment & Plan Note (Signed)
Stable post pacing 

## 2013-10-03 ENCOUNTER — Ambulatory Visit (INDEPENDENT_AMBULATORY_CARE_PROVIDER_SITE_OTHER): Payer: Medicare Other | Admitting: *Deleted

## 2013-10-03 DIAGNOSIS — I442 Atrioventricular block, complete: Secondary | ICD-10-CM

## 2013-10-03 LAB — PACEMAKER DEVICE OBSERVATION
AL AMPLITUDE: 1.9 mv
BRDY-0003RV: 110 {beats}/min
BRDY-0004RV: 110 {beats}/min
DEVICE MODEL PM: 124047
RV LEAD THRESHOLD: 0.8 V
VENTRICULAR PACING PM: 100

## 2013-10-03 NOTE — Progress Notes (Signed)
Pacemaker check in clinic. Normal device function. Thresholds, sensing, impedances consistent with previous measurements. Device programmed to maximize longevity. 100% A-fib, - coumadin for fall risk.   high ventricular rates noted. Device programmed at appropriate safety margins. Histogram blunted but  distribution appropriate for patient activity level since he is wheelchair restricted.   Device programmed to optimize intrinsic conduction. Estimated longevity 3.5 years. Patient enrolled in remote follow-up/TTM's with [employer]. Plan to follow every 3 months remotely and see annually in office. Patient education completed.  ROV 6 months with Dr. Graciela Husbands.

## 2013-10-17 ENCOUNTER — Other Ambulatory Visit: Payer: Self-pay | Admitting: Internal Medicine

## 2013-10-17 ENCOUNTER — Encounter: Payer: Self-pay | Admitting: Internal Medicine

## 2014-04-06 ENCOUNTER — Encounter: Payer: Self-pay | Admitting: *Deleted

## 2014-05-26 ENCOUNTER — Encounter: Payer: Self-pay | Admitting: Cardiology

## 2014-05-29 ENCOUNTER — Telehealth: Payer: Self-pay | Admitting: Internal Medicine

## 2014-05-29 NOTE — Telephone Encounter (Signed)
05-29-14 certified letter sent regarding pt's past due device check/mt ° °

## 2014-07-06 NOTE — Telephone Encounter (Signed)
Close Encounter 

## 2014-07-13 ENCOUNTER — Encounter: Payer: Self-pay | Admitting: Internal Medicine

## 2014-07-13 ENCOUNTER — Ambulatory Visit (INDEPENDENT_AMBULATORY_CARE_PROVIDER_SITE_OTHER): Payer: Medicare Other | Admitting: Internal Medicine

## 2014-07-13 VITALS — BP 128/67 | HR 70 | Ht 72.0 in

## 2014-07-13 DIAGNOSIS — Z950 Presence of cardiac pacemaker: Secondary | ICD-10-CM

## 2014-07-13 DIAGNOSIS — I4891 Unspecified atrial fibrillation: Secondary | ICD-10-CM

## 2014-07-13 DIAGNOSIS — I442 Atrioventricular block, complete: Secondary | ICD-10-CM

## 2014-07-13 DIAGNOSIS — I480 Paroxysmal atrial fibrillation: Secondary | ICD-10-CM

## 2014-07-13 LAB — MDC_IDC_ENUM_SESS_TYPE_INCLINIC
Battery Remaining Longevity: 29 mo
Lead Channel Impedance Value: 590 Ohm
Lead Channel Pacing Threshold Amplitude: 0.8 V
Lead Channel Sensing Intrinsic Amplitude: 1 mV
MDC IDC MSMT LEADCHNL RA IMPEDANCE VALUE: 480 Ohm
MDC IDC MSMT LEADCHNL RV PACING THRESHOLD PULSEWIDTH: 0.4 ms
MDC IDC PG SERIAL: 124047
MDC IDC STAT BRADY RA PERCENT PACED: 7 %
MDC IDC STAT BRADY RV PERCENT PACED: 100 %

## 2014-07-13 NOTE — Progress Notes (Signed)
Patient Care Team: Desmond DikeJohn Cameron, MD as PCP - General (Unknown Physician Specialty)   HPI  Alexander Adria DillL [name] is a 80 y.o. 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  He also has a history of bypass surgery and aortic valve replacement.   He has permanent atrial fibrillation  He remains very weak. He was found to have significant anemia. He was given a transfusion and started on iron. This apparently had no impact on his hemoglobin. It was last checked a couple of months ago.   He is able to get from the house to the car and the lobe around the house. .   We have no recent assessment of ventricular function coronary perfusion   Past Medical History  Diagnosis Date  . Atrial fibrillation   . Trifascicular block     pacemaker implant; [employer] [id number]  . Pacemaker     [employer]  . CHF (congestive heart failure)   . Ischemic cardiomyopathy     Past Surgical History  Procedure Laterality Date  . Pacemaker insertion      trifascicular block; [employer] [id number]  . Aortic valve replacement      2004  . Coronary artery bypass graft      Current Outpatient Prescriptions  Medication Sig Dispense Refill  . acetaminophen (TYLENOL) 500 MG tablet Take 500 mg by mouth 2 (two) times daily.       . digoxin (LANOXIN) 0.25 MG tablet Take 250 mcg by mouth daily.      Tery Sanfilippo. Docusate Calcium (STOOL SOFTENER PO) Take by mouth.      . Folic Acid-Vit B6-Vit B12 (FOLGARD) 0.8-10-0.115 MG TABS Take by mouth.      . furosemide (LASIX) 40 MG tablet Take 20 mg by mouth 2 (two) times daily. Mon/wed/fri 40 mg twice a day      . isosorbide mononitrate (IMDUR) 120 MG 24 hr tablet Take 120 mg by mouth daily.      Marland Kitchen. levothyroxine (LEVOTHROID) 50 MCG tablet Take 50 mcg by mouth daily.      . metoprolol tartrate (LOPRESSOR) 25 MG tablet Take 25 mg by mouth daily.      Marland Kitchen. omeprazole (PRILOSEC) 20 MG capsule Take  20 mg by mouth 2 (two) times daily before a meal.       . polysaccharide iron (NIFEREX) 150 MG CAPS capsule Take 150 mg by mouth daily. M, W, F      . potassium chloride SA (K-DUR,KLOR-CON) 20 MEQ tablet Take 20 mEq by mouth daily.      . vitamin C (ASCORBIC ACID) 500 MG tablet Take 500 mg by mouth daily.       No current facility-administered medications for this visit.    Allergies  Allergen Reactions  . Plavix [Clopidogrel Bisulfate]     Review of Systems negative except from HPI and PMH  Physical Exam BP 128/67  Pulse 70  Ht 6' (1.829 m) Well developed and well nourished in no acute distress HENT normal E scleral and icterus clear Neck Supple JVP flat; carotids brisk and full Clear to ausculation  Regular rate and rhythm, no murmurs gallops or rub Soft with active bowel sounds No clubbing cyanosis Trace Edema Alert and oriented, grossly normal motor and sensory function sitting in a wheel chair Skin Warm and Dry    Assessment and  Plan  Atrial fibrillation-permanent   Pacemaker-[employer]  Complete heart  block  Anemia   I wonder to what degree his fatigue is related to his anemia. We will recheck hemoglobin.  Plan to recheck a digoxin level. These are relatively high dose older man.  I'm not sure that he needs it for rate control.  Is not felt to be a candidate for anticoagulation because of his anemia.

## 2014-07-13 NOTE — Patient Instructions (Signed)
Your physician recommends that you continue on your current medications as directed. Please refer to the Current Medication list given to you today.  Labs today: CBCD, BMET, Digoxin level  Your physician wants you to follow-up in: 6 months with device clinic.  You will receive a reminder letter in the mail two months in advance. If you don't receive a letter, please call our office to schedule the follow-up appointment.  Your physician wants you to follow-up in: 1 year with Dr. Graciela HusbandsKlein.  You will receive a reminder letter in the mail two months in advance. If you don't receive a letter, please call our office to schedule the follow-up appointment.

## 2014-07-14 LAB — CBC WITH DIFFERENTIAL/PLATELET
BASOS ABS: 0 10*3/uL (ref 0.0–0.1)
Basophils Relative: 0.3 % (ref 0.0–3.0)
EOS ABS: 0.1 10*3/uL (ref 0.0–0.7)
Eosinophils Relative: 1.2 % (ref 0.0–5.0)
HEMATOCRIT: 32.6 % — AB (ref 39.0–52.0)
Hemoglobin: 10.6 g/dL — ABNORMAL LOW (ref 13.0–17.0)
Lymphocytes Relative: 24.9 % (ref 12.0–46.0)
Lymphs Abs: 2.5 10*3/uL (ref 0.7–4.0)
MCHC: 32.6 g/dL (ref 30.0–36.0)
MCV: 86.3 fl (ref 78.0–100.0)
MONOS PCT: 5.3 % (ref 3.0–12.0)
Monocytes Absolute: 0.5 10*3/uL (ref 0.1–1.0)
NEUTROS ABS: 6.8 10*3/uL (ref 1.4–7.7)
Neutrophils Relative %: 68.3 % (ref 43.0–77.0)
PLATELETS: 278 10*3/uL (ref 150.0–400.0)
RBC: 3.77 Mil/uL — ABNORMAL LOW (ref 4.22–5.81)
RDW: 19.9 % — ABNORMAL HIGH (ref 11.5–15.5)
WBC: 10 10*3/uL (ref 4.0–10.5)

## 2014-07-14 LAB — BASIC METABOLIC PANEL
BUN: 23 mg/dL (ref 6–23)
CALCIUM: 9.5 mg/dL (ref 8.4–10.5)
CO2: 29 mEq/L (ref 19–32)
CREATININE: 1.4 mg/dL (ref 0.4–1.5)
Chloride: 100 mEq/L (ref 96–112)
GFR: 52.3 mL/min — AB (ref >60.00)
Glucose, Bld: 96 mg/dL (ref 70–99)
POTASSIUM: 5.2 meq/L — AB (ref 3.5–5.1)
Sodium: 137 mEq/L (ref 135–145)

## 2014-07-14 LAB — DIGOXIN LEVEL: DIGOXIN LVL: 2.1 ng/mL — AB (ref 0.8–2.0)

## 2014-07-27 ENCOUNTER — Encounter: Payer: Self-pay | Admitting: Internal Medicine

## 2014-10-22 DEATH — deceased

## 2015-01-25 ENCOUNTER — Encounter: Payer: Self-pay | Admitting: *Deleted

## 2015-02-15 ENCOUNTER — Encounter: Payer: Self-pay | Admitting: *Deleted
